# Patient Record
Sex: Male | Born: 1975 | Race: White | Hispanic: No | Marital: Single | Smoking: Former smoker
Health system: Southern US, Community
[De-identification: ages and names within clinical notes are randomized; demographics above are authoritative.]

## PROBLEM LIST (undated history)

## (undated) DIAGNOSIS — G8929 Other chronic pain: Secondary | ICD-10-CM

## (undated) DIAGNOSIS — M545 Low back pain, unspecified: Secondary | ICD-10-CM

## (undated) DIAGNOSIS — I10 Essential (primary) hypertension: Secondary | ICD-10-CM

## (undated) DIAGNOSIS — K5790 Diverticulosis of intestine, part unspecified, without perforation or abscess without bleeding: Secondary | ICD-10-CM

## (undated) DIAGNOSIS — G4733 Obstructive sleep apnea (adult) (pediatric): Secondary | ICD-10-CM

## (undated) DIAGNOSIS — M5136 Other intervertebral disc degeneration, lumbar region: Secondary | ICD-10-CM

## (undated) DIAGNOSIS — Z9989 Dependence on other enabling machines and devices: Secondary | ICD-10-CM

## (undated) DIAGNOSIS — IMO0001 Reserved for inherently not codable concepts without codable children: Secondary | ICD-10-CM

## (undated) HISTORY — PX: COLONOSCOPY: SHX174

## (undated) HISTORY — PX: TONSILLECTOMY: SUR1361

## (undated) HISTORY — PX: BACK SURGERY: SHX140

## (undated) HISTORY — PX: INGUINAL HERNIA REPAIR: SUR1180

---

## 2015-06-27 ENCOUNTER — Ambulatory Visit: Payer: Self-pay | Admitting: Physician Assistant

## 2015-07-15 ENCOUNTER — Inpatient Hospital Stay (HOSPITAL_COMMUNITY)
Admission: RE | Admit: 2015-07-15 | Discharge: 2015-07-15 | Disposition: A | Discharge status: Home / Self Care | Payer: Self-pay | Source: Ambulatory Visit

## 2015-07-16 NOTE — Pre-Procedure Instructions (Signed)
Troy Bush  07/16/2015     Your procedure is scheduled on : Wednesday Jul 24, 2015 at 8:30 AM.  [facility] at 6:30 AM.  Call this number if you have problems the morning of surgery: [phone]    Remember:  Do not eat food or drink liquids after midnight.  Take these medicines the morning of surgery with A SIP OF WATER : NONE   Stop taking any vitamins, herbal medications/supplements, NSAIDs, Ibuprofen, Advil, Motrin, Aleve, etc on Wednesday May 3rd   Do not wear jewelry.  Do not wear lotions, powders, or cologne.   Men may shave face and neck.  Do not bring valuables to the hospital.  [facility] is not responsible for any belongings or valuables.  Contacts, dentures or bridgework may not be worn into surgery.  Leave your suitcase in the car.  After surgery it may be brought to your room.  For patients admitted to the hospital, discharge time will be determined by your treatment team.  Patients discharged the day of surgery will not be allowed to drive home.   Name and phone number of your driver:    Special instructions:  Shower using CHG soap the night before and the morning of your surgery  Please read over the following fact sheets that you were given. Pain Booklet, Coughing and Deep Breathing, Blood Transfusion Information, MRSA Information and Surgical Site Infection Prevention

## 2015-07-18 ENCOUNTER — Encounter (HOSPITAL_COMMUNITY)
Admission: RE | Admit: 2015-07-18 | Discharge: 2015-07-18 | Disposition: A | Discharge status: Home / Self Care | Payer: BLUE CROSS/BLUE SHIELD | Source: Ambulatory Visit | Attending: Orthopedic Surgery | Admitting: Orthopedic Surgery

## 2015-07-18 ENCOUNTER — Encounter (HOSPITAL_COMMUNITY): Payer: Self-pay

## 2015-07-18 DIAGNOSIS — Z01812 Encounter for preprocedural laboratory examination: Secondary | ICD-10-CM | POA: Diagnosis not present

## 2015-07-18 DIAGNOSIS — M5116 Intervertebral disc disorders with radiculopathy, lumbar region: Secondary | ICD-10-CM | POA: Diagnosis not present

## 2015-07-18 DIAGNOSIS — Z87891 Personal history of nicotine dependence: Secondary | ICD-10-CM | POA: Insufficient documentation

## 2015-07-18 DIAGNOSIS — I10 Essential (primary) hypertension: Secondary | ICD-10-CM | POA: Diagnosis not present

## 2015-07-18 DIAGNOSIS — G4733 Obstructive sleep apnea (adult) (pediatric): Secondary | ICD-10-CM | POA: Insufficient documentation

## 2015-07-18 DIAGNOSIS — Z79899 Other long term (current) drug therapy: Secondary | ICD-10-CM | POA: Diagnosis not present

## 2015-07-18 DIAGNOSIS — Z0183 Encounter for blood typing: Secondary | ICD-10-CM | POA: Insufficient documentation

## 2015-07-18 DIAGNOSIS — Z01818 Encounter for other preprocedural examination: Secondary | ICD-10-CM | POA: Diagnosis not present

## 2015-07-18 HISTORY — DX: Other intervertebral disc degeneration, lumbar region: M51.36

## 2015-07-18 HISTORY — DX: Essential (primary) hypertension: I10

## 2015-07-18 HISTORY — DX: Diverticulosis of intestine, part unspecified, without perforation or abscess without bleeding: K57.90

## 2015-07-18 HISTORY — DX: Reserved for inherently not codable concepts without codable children: IMO0001

## 2015-07-18 LAB — CBC
HEMATOCRIT: 44.9 % (ref 39.0–52.0)
HEMOGLOBIN: 15.4 g/dL (ref 13.0–17.0)
MCH: 29.3 pg (ref 26.0–34.0)
MCHC: 34.3 g/dL (ref 30.0–36.0)
MCV: 85.4 fL (ref 78.0–100.0)
Platelets: 214 10*3/uL (ref 150–400)
RBC: 5.26 MIL/uL (ref 4.22–5.81)
RDW: 12.3 % (ref 11.5–15.5)
WBC: 7.6 10*3/uL (ref 4.0–10.5)

## 2015-07-18 LAB — BASIC METABOLIC PANEL
ANION GAP: 11 (ref 5–15)
BUN: 9 mg/dL (ref 6–20)
CHLORIDE: 102 mmol/L (ref 101–111)
CO2: 24 mmol/L (ref 22–32)
CREATININE: 0.94 mg/dL (ref 0.61–1.24)
Calcium: 9.5 mg/dL (ref 8.9–10.3)
GFR calc non Af Amer: >60 mL/min (ref >60)
Glucose, Bld: 175 mg/dL — ABNORMAL HIGH (ref 65–99)
POTASSIUM: 3.5 mmol/L (ref 3.5–5.1)
Sodium: 137 mmol/L (ref 135–145)

## 2015-07-18 LAB — SURGICAL PCR SCREEN
MRSA, PCR: NEGATIVE
Staphylococcus aureus: NEGATIVE

## 2015-07-18 LAB — TYPE AND SCREEN
ABO/RH(D): A NEG
Antibody Screen: NEGATIVE

## 2015-07-18 LAB — ABO/RH: ABO/RH(D): A NEG

## 2015-07-18 NOTE — Progress Notes (Signed)
Mr Troy Bush denies chest pain, he does not see a cardiologist.  Patient saw PCP today and had an EKG.  [facility] [place].  I will request records.

## 2015-07-19 NOTE — Progress Notes (Addendum)
Anesthesia Chart Review:  Pt is a 40 year old male scheduled for L3-4 XLIF and L3-4 posterior spinal interbody fusion on 07/24/2015 with Dr. Shon BatonBrooks.   PCP is Collie SiadMichael Cartledge, PA who has cleared pt for surgery.   PMH includes:  HTN, OSA. Former smoker. BMI 40  Medications include: lisinopril-hctz  Preoperative labs reviewed.  Glucose 175, no dx DM.   EKG 07/18/15: sinus rhythm. Anteroseptal infarct. High lateral infarct. Slight intraventricular conduction delay. Moderate high lateral repolarization disturbance secondary to infarct. Negative T in I and AVL. No acute changes per Mr. Bari MantisCartledge.   If no changes, I anticipate pt can proceed with surgery as scheduled.   Rica Mastngela Anabia Weatherwax, FNP-BC [facility]/Anesthesiology Phone: [phone] 07/22/2015 1:53 PM

## 2015-07-24 ENCOUNTER — Encounter (HOSPITAL_COMMUNITY): Payer: Self-pay | Admitting: *Deleted

## 2015-07-24 ENCOUNTER — Encounter (HOSPITAL_COMMUNITY)
Admission: RE | Disposition: A | Discharge status: Home / Self Care | Payer: Self-pay | Source: Ambulatory Visit | Attending: Orthopedic Surgery

## 2015-07-24 ENCOUNTER — Inpatient Hospital Stay (HOSPITAL_COMMUNITY): Payer: BLUE CROSS/BLUE SHIELD | Admitting: Emergency Medicine

## 2015-07-24 ENCOUNTER — Inpatient Hospital Stay (HOSPITAL_COMMUNITY): Payer: BLUE CROSS/BLUE SHIELD | Admitting: Anesthesiology

## 2015-07-24 ENCOUNTER — Inpatient Hospital Stay (HOSPITAL_COMMUNITY)
Admission: RE | Admit: 2015-07-24 | Discharge: 2015-07-26 | DRG: 460 | Disposition: A | Discharge status: Home / Self Care | Payer: BLUE CROSS/BLUE SHIELD | Source: Ambulatory Visit | Attending: Orthopedic Surgery | Admitting: Orthopedic Surgery

## 2015-07-24 ENCOUNTER — Inpatient Hospital Stay (HOSPITAL_COMMUNITY): Payer: BLUE CROSS/BLUE SHIELD

## 2015-07-24 DIAGNOSIS — I498 Other specified cardiac arrhythmias: Secondary | ICD-10-CM | POA: Diagnosis not present

## 2015-07-24 DIAGNOSIS — M5441 Lumbago with sciatica, right side: Secondary | ICD-10-CM | POA: Diagnosis present

## 2015-07-24 DIAGNOSIS — R739 Hyperglycemia, unspecified: Secondary | ICD-10-CM | POA: Diagnosis present

## 2015-07-24 DIAGNOSIS — E119 Type 2 diabetes mellitus without complications: Secondary | ICD-10-CM | POA: Diagnosis present

## 2015-07-24 DIAGNOSIS — G4733 Obstructive sleep apnea (adult) (pediatric): Secondary | ICD-10-CM | POA: Diagnosis present

## 2015-07-24 DIAGNOSIS — Z419 Encounter for procedure for purposes other than remedying health state, unspecified: Secondary | ICD-10-CM

## 2015-07-24 DIAGNOSIS — M549 Dorsalgia, unspecified: Secondary | ICD-10-CM | POA: Diagnosis present

## 2015-07-24 DIAGNOSIS — G4730 Sleep apnea, unspecified: Secondary | ICD-10-CM | POA: Diagnosis present

## 2015-07-24 DIAGNOSIS — Z79899 Other long term (current) drug therapy: Secondary | ICD-10-CM

## 2015-07-24 DIAGNOSIS — I10 Essential (primary) hypertension: Secondary | ICD-10-CM | POA: Diagnosis present

## 2015-07-24 DIAGNOSIS — E089 Diabetes mellitus due to underlying condition without complications: Secondary | ICD-10-CM | POA: Diagnosis not present

## 2015-07-24 DIAGNOSIS — Z981 Arthrodesis status: Secondary | ICD-10-CM

## 2015-07-24 DIAGNOSIS — Z87891 Personal history of nicotine dependence: Secondary | ICD-10-CM

## 2015-07-24 DIAGNOSIS — M545 Low back pain: Secondary | ICD-10-CM | POA: Diagnosis present

## 2015-07-24 HISTORY — PX: POSTERIOR FUSION LUMBAR SPINE: SUR632

## 2015-07-24 HISTORY — PX: ANTERIOR LAT LUMBAR FUSION: SHX1168

## 2015-07-24 HISTORY — DX: Other chronic pain: G89.29

## 2015-07-24 HISTORY — DX: Obstructive sleep apnea (adult) (pediatric): G47.33

## 2015-07-24 HISTORY — DX: Low back pain: M54.5

## 2015-07-24 HISTORY — DX: Dependence on other enabling machines and devices: Z99.89

## 2015-07-24 HISTORY — DX: Low back pain, unspecified: M54.50

## 2015-07-24 LAB — BASIC METABOLIC PANEL
Anion gap: 10 (ref 5–15)
BUN: 8 mg/dL (ref 6–20)
CALCIUM: 8.8 mg/dL — AB (ref 8.9–10.3)
CHLORIDE: 103 mmol/L (ref 101–111)
CO2: 23 mmol/L (ref 22–32)
CREATININE: 0.98 mg/dL (ref 0.61–1.24)
GFR calc Af Amer: >60 mL/min (ref >60)
GFR calc non Af Amer: >60 mL/min (ref >60)
GLUCOSE: 277 mg/dL — AB (ref 65–99)
Potassium: 3.9 mmol/L (ref 3.5–5.1)
Sodium: 136 mmol/L (ref 135–145)

## 2015-07-24 LAB — MAGNESIUM: Magnesium: 1.8 mg/dL (ref 1.7–2.4)

## 2015-07-24 LAB — TROPONIN I: Troponin I: <0.03 ng/mL (ref <0.031)

## 2015-07-24 SURGERY — ANTERIOR LATERAL LUMBAR FUSION 1 LEVEL
Anesthesia: General

## 2015-07-24 MED ORDER — ONDANSETRON HCL 4 MG/2ML IJ SOLN
INTRAMUSCULAR | Status: AC
  Filled 2015-07-24: qty 2

## 2015-07-24 MED ORDER — OXYCODONE-ACETAMINOPHEN 10-325 MG PO TABS: 1.0000 | ORAL_TABLET | ORAL | Status: AC | PRN

## 2015-07-24 MED ORDER — CEFAZOLIN SODIUM-DEXTROSE 2-4 GM/100ML-% IV SOLN
INTRAVENOUS | Status: AC
  Filled 2015-07-24: qty 100

## 2015-07-24 MED ORDER — ONDANSETRON HCL 4 MG/2ML IJ SOLN
4.0000 mg | INTRAMUSCULAR | Status: DC | PRN
  Filled 2015-07-24: qty 2

## 2015-07-24 MED ORDER — SODIUM CHLORIDE 0.9 % IV SOLN: 250.0000 mL | INTRAVENOUS | Status: DC

## 2015-07-24 MED ORDER — LOSARTAN POTASSIUM 50 MG PO TABS
100.0000 mg | ORAL_TABLET | Freq: Every day | ORAL | Status: DC
  Administered 2015-07-25 – 2015-07-26 (×2): 100 mg via ORAL
  Filled 2015-07-24 (×2): qty 2

## 2015-07-24 MED ORDER — LOSARTAN POTASSIUM-HCTZ 100-12.5 MG PO TABS: 1.0000 | ORAL_TABLET | Freq: Every day | ORAL | Status: DC

## 2015-07-24 MED ORDER — MORPHINE SULFATE (PF) 2 MG/ML IV SOLN
1.0000 mg | INTRAVENOUS | Status: DC | PRN
  Administered 2015-07-24 – 2015-07-25 (×4): 4 mg via INTRAVENOUS
  Filled 2015-07-24 (×4): qty 2

## 2015-07-24 MED ORDER — EPHEDRINE SULFATE 50 MG/ML IJ SOLN
INTRAMUSCULAR | Status: DC | PRN
  Administered 2015-07-24: 10 mg via INTRAVENOUS

## 2015-07-24 MED ORDER — ONDANSETRON HCL 4 MG/2ML IJ SOLN
INTRAMUSCULAR | Status: DC | PRN
  Administered 2015-07-24: 4 mg via INTRAVENOUS

## 2015-07-24 MED ORDER — LACTATED RINGERS IV SOLN
INTRAVENOUS | Status: DC
Start: 2015-07-24 — End: 2015-07-26
  Administered 2015-07-24: 1000 mL via INTRAVENOUS

## 2015-07-24 MED ORDER — ASPIRIN EC 81 MG PO TBEC: 81.0000 mg | DELAYED_RELEASE_TABLET | Freq: Every day | ORAL | Status: AC

## 2015-07-24 MED ORDER — PROPOFOL 1000 MG/100ML IV EMUL
INTRAVENOUS | Status: AC
  Filled 2015-07-24: qty 200

## 2015-07-24 MED ORDER — PHENYLEPHRINE 40 MCG/ML (10ML) SYRINGE FOR IV PUSH (FOR BLOOD PRESSURE SUPPORT)
PREFILLED_SYRINGE | INTRAVENOUS | Status: AC
  Filled 2015-07-24: qty 10

## 2015-07-24 MED ORDER — MAGNESIUM CITRATE PO SOLN
0.5000 | Freq: Once | ORAL | Status: AC
  Administered 2015-07-25: 0.5 via ORAL
  Filled 2015-07-24: qty 296

## 2015-07-24 MED ORDER — HEMOSTATIC AGENTS (NO CHARGE) OPTIME
TOPICAL | Status: DC | PRN
  Administered 2015-07-24 (×2): 1 via TOPICAL

## 2015-07-24 MED ORDER — LIDOCAINE HCL (CARDIAC) 20 MG/ML IV SOLN
INTRAVENOUS | Status: DC | PRN
  Administered 2015-07-24: 100 mg via INTRAVENOUS

## 2015-07-24 MED ORDER — HYDROMORPHONE HCL 1 MG/ML IJ SOLN
INTRAMUSCULAR | Status: AC
  Administered 2015-07-24: 0.5 mg via INTRAVENOUS
  Filled 2015-07-24: qty 1

## 2015-07-24 MED ORDER — FENTANYL CITRATE (PF) 250 MCG/5ML IJ SOLN
INTRAMUSCULAR | Status: AC
  Filled 2015-07-24: qty 5

## 2015-07-24 MED ORDER — MENTHOL 3 MG MT LOZG: 1.0000 | LOZENGE | OROMUCOSAL | Status: DC | PRN

## 2015-07-24 MED ORDER — SODIUM CHLORIDE 0.9% FLUSH
3.0000 mL | Freq: Two times a day (BID) | INTRAVENOUS | Status: DC
  Administered 2015-07-24 – 2015-07-25 (×3): 3 mL via INTRAVENOUS

## 2015-07-24 MED ORDER — DEXAMETHASONE SODIUM PHOSPHATE 10 MG/ML IJ SOLN
INTRAMUSCULAR | Status: AC
  Filled 2015-07-24: qty 1

## 2015-07-24 MED ORDER — ONDANSETRON HCL 4 MG/2ML IJ SOLN: 4.0000 mg | Freq: Once | INTRAMUSCULAR | Status: DC | PRN

## 2015-07-24 MED ORDER — GLYCOPYRROLATE 0.2 MG/ML IJ SOLN
INTRAMUSCULAR | Status: DC | PRN
  Administered 2015-07-24: 0.4 mg via INTRAVENOUS

## 2015-07-24 MED ORDER — THROMBIN 20000 UNITS EX SOLR
CUTANEOUS | Status: AC
  Filled 2015-07-24: qty 20000

## 2015-07-24 MED ORDER — SUCCINYLCHOLINE CHLORIDE 200 MG/10ML IV SOSY
PREFILLED_SYRINGE | INTRAVENOUS | Status: AC
  Filled 2015-07-24: qty 10

## 2015-07-24 MED ORDER — METHOCARBAMOL 500 MG PO TABS
500.0000 mg | ORAL_TABLET | Freq: Four times a day (QID) | ORAL | Status: DC | PRN
  Administered 2015-07-24 – 2015-07-26 (×4): 500 mg via ORAL
  Filled 2015-07-24 (×3): qty 1

## 2015-07-24 MED ORDER — ACETAMINOPHEN 10 MG/ML IV SOLN
INTRAVENOUS | Status: AC
  Filled 2015-07-24: qty 100

## 2015-07-24 MED ORDER — PHENYLEPHRINE HCL 10 MG/ML IJ SOLN
10.0000 mg | INTRAVENOUS | Status: DC | PRN
  Administered 2015-07-24: 30 ug/min via INTRAVENOUS

## 2015-07-24 MED ORDER — HYDROCHLOROTHIAZIDE 12.5 MG PO CAPS
12.5000 mg | ORAL_CAPSULE | Freq: Every day | ORAL | Status: DC
  Administered 2015-07-25 – 2015-07-26 (×2): 12.5 mg via ORAL
  Filled 2015-07-24 (×2): qty 1

## 2015-07-24 MED ORDER — LIDOCAINE 2% (20 MG/ML) 5 ML SYRINGE
INTRAMUSCULAR | Status: AC
  Filled 2015-07-24: qty 5

## 2015-07-24 MED ORDER — DEXAMETHASONE 4 MG PO TABS
4.0000 mg | ORAL_TABLET | Freq: Four times a day (QID) | ORAL | Status: AC
  Administered 2015-07-24: 4 mg via ORAL
  Filled 2015-07-24: qty 1

## 2015-07-24 MED ORDER — HYDROMORPHONE HCL 1 MG/ML IJ SOLN
0.2500 mg | INTRAMUSCULAR | Status: DC | PRN
  Administered 2015-07-24 (×2): 0.5 mg via INTRAVENOUS
  Administered 2015-07-24 (×2): 0.25 mg via INTRAVENOUS

## 2015-07-24 MED ORDER — LACTATED RINGERS IV SOLN
INTRAVENOUS | Status: DC | PRN
  Administered 2015-07-24 (×3): via INTRAVENOUS

## 2015-07-24 MED ORDER — CEFAZOLIN SODIUM 1 G IJ SOLR
INTRAMUSCULAR | Status: AC
  Filled 2015-07-24: qty 10

## 2015-07-24 MED ORDER — DEXAMETHASONE SODIUM PHOSPHATE 4 MG/ML IJ SOLN
4.0000 mg | Freq: Four times a day (QID) | INTRAMUSCULAR | Status: AC
  Administered 2015-07-24 – 2015-07-25 (×2): 4 mg via INTRAVENOUS
  Filled 2015-07-24 (×2): qty 1

## 2015-07-24 MED ORDER — FENTANYL CITRATE (PF) 100 MCG/2ML IJ SOLN
INTRAMUSCULAR | Status: DC | PRN
  Administered 2015-07-24 (×2): 100 ug via INTRAVENOUS
  Administered 2015-07-24: 50 ug via INTRAVENOUS
  Administered 2015-07-24: 150 ug via INTRAVENOUS
  Administered 2015-07-24 (×3): 100 ug via INTRAVENOUS

## 2015-07-24 MED ORDER — ACETAMINOPHEN 10 MG/ML IV SOLN
INTRAVENOUS | Status: DC | PRN
  Administered 2015-07-24: 1000 mg via INTRAVENOUS

## 2015-07-24 MED ORDER — PROPOFOL 10 MG/ML IV BOLUS
INTRAVENOUS | Status: AC
  Filled 2015-07-24: qty 20

## 2015-07-24 MED ORDER — SODIUM CHLORIDE 0.9% FLUSH: 3.0000 mL | INTRAVENOUS | Status: DC | PRN

## 2015-07-24 MED ORDER — CEFAZOLIN SODIUM 1-5 GM-% IV SOLN
1.0000 g | Freq: Three times a day (TID) | INTRAVENOUS | Status: AC
  Administered 2015-07-24 – 2015-07-25 (×2): 1 g via INTRAVENOUS
  Filled 2015-07-24 (×2): qty 50

## 2015-07-24 MED ORDER — METHOCARBAMOL 500 MG PO TABS: 500.0000 mg | ORAL_TABLET | Freq: Three times a day (TID) | ORAL | Status: AC | PRN

## 2015-07-24 MED ORDER — BUPIVACAINE-EPINEPHRINE 0.25% -1:200000 IJ SOLN
INTRAMUSCULAR | Status: DC | PRN
  Administered 2015-07-24: 20 mL

## 2015-07-24 MED ORDER — DOCUSATE SODIUM 100 MG PO CAPS: 100.0000 mg | ORAL_CAPSULE | Freq: Three times a day (TID) | ORAL | Status: AC | PRN

## 2015-07-24 MED ORDER — FLEET ENEMA 7-19 GM/118ML RE ENEM: 1.0000 | ENEMA | Freq: Once | RECTAL | Status: DC

## 2015-07-24 MED ORDER — PHENYLEPHRINE HCL 10 MG/ML IJ SOLN
INTRAMUSCULAR | Status: DC | PRN
  Administered 2015-07-24 (×2): 80 ug via INTRAVENOUS

## 2015-07-24 MED ORDER — PHENOL 1.4 % MT LIQD: 1.0000 | OROMUCOSAL | Status: DC | PRN

## 2015-07-24 MED ORDER — ROCURONIUM BROMIDE 50 MG/5ML IV SOLN
INTRAVENOUS | Status: AC
  Filled 2015-07-24: qty 1

## 2015-07-24 MED ORDER — DEXTROSE 5 % IV SOLN
3.0000 g | Freq: Once | INTRAVENOUS | Status: DC
  Filled 2015-07-24: qty 3000

## 2015-07-24 MED ORDER — METHOCARBAMOL 500 MG PO TABS
ORAL_TABLET | ORAL | Status: AC
  Filled 2015-07-24: qty 1

## 2015-07-24 MED ORDER — BUPIVACAINE-EPINEPHRINE (PF) 0.25% -1:200000 IJ SOLN
INTRAMUSCULAR | Status: AC
  Filled 2015-07-24: qty 30

## 2015-07-24 MED ORDER — 0.9 % SODIUM CHLORIDE (POUR BTL) OPTIME
TOPICAL | Status: DC | PRN
  Administered 2015-07-24 (×2): 1000 mL

## 2015-07-24 MED ORDER — EPHEDRINE 5 MG/ML INJ
INTRAVENOUS | Status: AC
  Filled 2015-07-24: qty 10

## 2015-07-24 MED ORDER — HYDROMORPHONE HCL 1 MG/ML IJ SOLN
INTRAMUSCULAR | Status: AC
  Filled 2015-07-24: qty 1

## 2015-07-24 MED ORDER — PROPOFOL 10 MG/ML IV BOLUS
INTRAVENOUS | Status: DC | PRN
  Administered 2015-07-24: 50 mg via INTRAVENOUS

## 2015-07-24 MED ORDER — MIDAZOLAM HCL 2 MG/2ML IJ SOLN
INTRAMUSCULAR | Status: AC
  Filled 2015-07-24: qty 2

## 2015-07-24 MED ORDER — MEPERIDINE HCL 25 MG/ML IJ SOLN: 6.2500 mg | INTRAMUSCULAR | Status: DC | PRN

## 2015-07-24 MED ORDER — ONDANSETRON HCL 4 MG PO TABS: 4.0000 mg | ORAL_TABLET | Freq: Three times a day (TID) | ORAL | Status: AC | PRN

## 2015-07-24 MED ORDER — DEXAMETHASONE SODIUM PHOSPHATE 10 MG/ML IJ SOLN
INTRAMUSCULAR | Status: DC | PRN
  Administered 2015-07-24: 10 mg via INTRAVENOUS

## 2015-07-24 MED ORDER — CEFAZOLIN SODIUM-DEXTROSE 2-4 GM/100ML-% IV SOLN
2.0000 g | INTRAVENOUS | Status: AC
  Administered 2015-07-24 (×2): 3 g via INTRAVENOUS

## 2015-07-24 MED ORDER — OXYCODONE HCL 5 MG PO TABS
10.0000 mg | ORAL_TABLET | ORAL | Status: DC | PRN
  Administered 2015-07-24 – 2015-07-26 (×6): 10 mg via ORAL
  Filled 2015-07-24 (×6): qty 2

## 2015-07-24 MED ORDER — MIDAZOLAM HCL 5 MG/5ML IJ SOLN
INTRAMUSCULAR | Status: DC | PRN
  Administered 2015-07-24: 2 mg via INTRAVENOUS

## 2015-07-24 MED ORDER — SUCCINYLCHOLINE CHLORIDE 200 MG/10ML IV SOSY
PREFILLED_SYRINGE | INTRAVENOUS | Status: DC | PRN
  Administered 2015-07-24: 160 mg via INTRAVENOUS

## 2015-07-24 MED ORDER — THROMBIN 20000 UNITS EX KIT
PACK | CUTANEOUS | Status: DC | PRN
  Administered 2015-07-24: 20000 [IU] via TOPICAL

## 2015-07-24 MED ORDER — METHOCARBAMOL 1000 MG/10ML IJ SOLN
500.0000 mg | Freq: Four times a day (QID) | INTRAVENOUS | Status: DC | PRN
  Filled 2015-07-24: qty 5

## 2015-07-24 SURGICAL SUPPLY — 84 items
BLADE CLIPPER SURG (BLADE) ×3 IMPLANT
BLADE SURG 10 STRL SS (BLADE) ×6 IMPLANT
BLADE SURG ROTATE 9660 (MISCELLANEOUS) IMPLANT
BONE MATRIX OSTEOCEL PRO MED (Bone Implant) ×3 IMPLANT
CAGE COROENT XL TI 12X18X50-10 (Cage) ×3 IMPLANT
CLOSURE STERI-STRIP 1/2X4 (GAUZE/BANDAGES/DRESSINGS) ×2
CLOSURE WOUND 1/2 X4 (GAUZE/BANDAGES/DRESSINGS)
CLSR STERI-STRIP ANTIMIC 1/2X4 (GAUZE/BANDAGES/DRESSINGS) ×4 IMPLANT
CORDS BIPOLAR (ELECTRODE) ×3 IMPLANT
COVER SURGICAL LIGHT HANDLE (MISCELLANEOUS) ×3 IMPLANT
DRAPE C-ARM 42X72 X-RAY (DRAPES) ×3 IMPLANT
DRAPE C-ARMOR (DRAPES) ×3 IMPLANT
DRAPE INCISE IOBAN 66X45 STRL (DRAPES) ×6 IMPLANT
DRAPE ORTHO SPLIT 77X108 STRL (DRAPES) ×2
DRAPE POUCH INSTRU U-SHP 10X18 (DRAPES) ×3 IMPLANT
DRAPE SURG ORHT 6 SPLT 77X108 (DRAPES) ×1 IMPLANT
DRAPE U-SHAPE 47X51 STRL (DRAPES) ×6 IMPLANT
DRSG AQUACEL AG ADV 3.5X10 (GAUZE/BANDAGES/DRESSINGS) ×6 IMPLANT
DRSG MEPILEX BORDER 4X8 (GAUZE/BANDAGES/DRESSINGS) ×3 IMPLANT
DURAPREP 26ML APPLICATOR (WOUND CARE) ×3 IMPLANT
ELECT BLADE 4.0 EZ CLEAN MEGAD (MISCELLANEOUS) ×3
ELECT PENCIL ROCKER SW 15FT (MISCELLANEOUS) ×3 IMPLANT
ELECT REM PT RETURN 9FT ADLT (ELECTROSURGICAL) ×3
ELECTRODE BLDE 4.0 EZ CLN MEGD (MISCELLANEOUS) ×1 IMPLANT
ELECTRODE REM PT RTRN 9FT ADLT (ELECTROSURGICAL) ×1 IMPLANT
GAUZE SPONGE 4X4 16PLY XRAY LF (GAUZE/BANDAGES/DRESSINGS) ×3 IMPLANT
GLOVE BIO SURGEON STRL SZ 6.5 (GLOVE) ×2 IMPLANT
GLOVE BIO SURGEONS STRL SZ 6.5 (GLOVE) ×1
GLOVE BIOGEL PI IND STRL 6.5 (GLOVE) ×1 IMPLANT
GLOVE BIOGEL PI IND STRL 8.5 (GLOVE) ×1 IMPLANT
GLOVE BIOGEL PI INDICATOR 6.5 (GLOVE) ×2
GLOVE BIOGEL PI INDICATOR 8.5 (GLOVE) ×2
GLOVE SS BIOGEL STRL SZ 8.5 (GLOVE) ×1 IMPLANT
GLOVE SUPERSENSE BIOGEL SZ 8.5 (GLOVE) ×2
GOWN STRL REUS W/ TWL XL LVL3 (GOWN DISPOSABLE) ×2 IMPLANT
GOWN STRL REUS W/TWL 2XL LVL3 (GOWN DISPOSABLE) ×6 IMPLANT
GOWN STRL REUS W/TWL XL LVL3 (GOWN DISPOSABLE) ×4
GUIDEWIRE NITINOL BEVEL TIP (WIRE) ×12 IMPLANT
KIT BASIN OR (CUSTOM PROCEDURE TRAY) ×3 IMPLANT
KIT DILATOR XLIF 5 (KITS) ×1 IMPLANT
KIT ROOM TURNOVER OR (KITS) ×3 IMPLANT
KIT SURGICAL ACCESS MAXCESS 4 (KITS) ×6 IMPLANT
KIT XLIF (KITS) ×2
MODULE NVM5 NEXT GEN EMG (NEEDLE) ×3 IMPLANT
NEEDLE 22X1 1/2 (OR ONLY) (NEEDLE) ×3 IMPLANT
NEEDLE I-PASS III (NEEDLE) ×3 IMPLANT
NEEDLE SPNL 18GX3.5 QUINCKE PK (NEEDLE) ×3 IMPLANT
NS IRRIG 1000ML POUR BTL (IV SOLUTION) ×6 IMPLANT
PACK LAMINECTOMY ORTHO (CUSTOM PROCEDURE TRAY) ×3 IMPLANT
PACK UNIVERSAL I (CUSTOM PROCEDURE TRAY) ×3 IMPLANT
PAD ARMBOARD 7.5X6 YLW CONV (MISCELLANEOUS) ×6 IMPLANT
ROD RELINE MAS LORD 5.5X45MM (Rod) ×6 IMPLANT
SCREW LOCK RELINE 5.5 TULIP (Screw) ×12 IMPLANT
SCREW RELINE MAS POLY 6.5X40MM (Screw) ×6 IMPLANT
SCREW RELINE RED 6.5X45MM POLY (Screw) ×6 IMPLANT
SPONGE LAP 4X18 X RAY DECT (DISPOSABLE) ×9 IMPLANT
SPONGE SURGIFOAM ABS GEL 100 (HEMOSTASIS) ×3 IMPLANT
STAPLER VISISTAT 35W (STAPLE) ×3 IMPLANT
STRIP CLOSURE SKIN 1/2X4 (GAUZE/BANDAGES/DRESSINGS) IMPLANT
SURGIFLO W/THROMBIN 8M KIT (HEMOSTASIS) ×3 IMPLANT
SUT BONE WAX W31G (SUTURE) ×3 IMPLANT
SUT MNCRL AB 3-0 PS2 18 (SUTURE) ×9 IMPLANT
SUT MON AB 2-0 CT1 36 (SUTURE) IMPLANT
SUT MON AB 3-0 SH 27 (SUTURE) ×8
SUT MON AB 3-0 SH27 (SUTURE) ×4 IMPLANT
SUT STRATAFIX 0 PDS 27 VIOLET (SUTURE) ×3
SUT STRATAFIX 1PDS 45CM VIOLET (SUTURE) ×3 IMPLANT
SUT STRATAFIX MNCRL+ 3-0 PS-2 (SUTURE) ×1
SUT STRATAFIX MONOCRYL 3-0 (SUTURE) ×2
SUT STRATAFIX SPIRAL + 2-0 (SUTURE) ×3 IMPLANT
SUT VIC AB 1 CT1 27 (SUTURE) ×4
SUT VIC AB 1 CT1 27XBRD ANBCTR (SUTURE) ×2 IMPLANT
SUT VIC AB 1 CTX 36 (SUTURE) ×4
SUT VIC AB 1 CTX36XBRD ANBCTR (SUTURE) ×2 IMPLANT
SUT VIC AB 2-0 CT1 18 (SUTURE) ×12 IMPLANT
SUTURE STRATFX 0 PDS 27 VIOLET (SUTURE) ×1 IMPLANT
SUTURE STRATFX MNCRL+ 3-0 PS-2 (SUTURE) ×1 IMPLANT
SYR BULB IRRIGATION 50ML (SYRINGE) ×3 IMPLANT
SYR CONTROL 10ML LL (SYRINGE) ×3 IMPLANT
TAPE CLOTH 4X10 WHT NS (GAUZE/BANDAGES/DRESSINGS) ×6 IMPLANT
TOWEL OR 17X24 6PK STRL BLUE (TOWEL DISPOSABLE) ×3 IMPLANT
TOWEL OR 17X26 10 PK STRL BLUE (TOWEL DISPOSABLE) ×3 IMPLANT
TRAY FOLEY CATH 16FR SILVER (SET/KITS/TRAYS/PACK) ×3 IMPLANT
WATER STERILE IRR 1000ML POUR (IV SOLUTION) ×3 IMPLANT

## 2015-07-24 NOTE — Brief Op Note (Signed)
07/24/2015  12:12 PM  PATIENT:  Troy Bush  40 y.o. 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Other Dictation: Dictation Number [id number]  PLAN OF CARE: Admit to inpatient   PATIENT DISPOSITION:  PACU - hemodynamically stable.

## 2015-07-24 NOTE — Anesthesia Procedure Notes (Signed)
Procedure Name: Intubation Date/Time: 07/24/2015 8:42 AM Performed by: Ferol LuzMCMILLEN, Amor Hyle L Pre-anesthesia Checklist: Patient identified, Emergency Drugs available, Suction available and Patient being monitored Patient Re-evaluated:Patient Re-evaluated prior to inductionOxygen Delivery Method: Circle System Utilized Preoxygenation: Pre-oxygenation with 100% oxygen Intubation Type: IV induction Ventilation: Mask ventilation without difficulty Laryngoscope Size: Mac and 4 Grade View: Grade I Tube type: Oral Tube size: 8.0 mm Number of attempts: 1 Airway Equipment and Method: Stylet and Oral airway Placement Confirmation: ETT inserted through vocal cords under direct vision,  positive ETCO2 and breath sounds checked- equal and bilateral Secured at: 22 cm Tube secured with: Tape Dental Injury: Teeth and Oropharynx as per pre-operative assessment

## 2015-07-24 NOTE — Anesthesia Postprocedure Evaluation (Signed)
Anesthesia Post Note  Patient: Troy Bush  Procedure(s) Performed: Procedure(s) (LRB): XLIF L3 - L4 1 LEVEL AND A POSTERIOR SPINAL FUSION INTERBODY L3 - L4 (N/A)  Patient location during evaluation: PACU Anesthesia Type: General Level of consciousness: awake and alert Pain management: pain level controlled Vital Signs Assessment: post-procedure vital signs reviewed and stable Respiratory status: spontaneous breathing, nonlabored ventilation, respiratory function stable and patient connected to nasal cannula oxygen Cardiovascular status: blood pressure returned to baseline and stable Postop Assessment: no signs of nausea or vomiting Anesthetic complications: no Comments: Post Op EKG - NSR, no significant ST changes. Troponin negative. Dr. Shon BatonBrooks plans to obtain consult for junctional rhythm during surgery.     Last Vitals:  Filed Vitals:   07/24/15 1600 07/24/15 1615  BP: 108/57 102/54  Pulse:    Temp:    Resp:      Last Pain:  Filed Vitals:   07/24/15 1634  PainSc: 5                  Shelton SilvasKevin D Fany Cavanaugh

## 2015-07-24 NOTE — H&P (Signed)
History of Present Illness  The patient is a 40 year old male who comes in today for a preoperative History and Physical. The patient is scheduled for a XLIF L3-4 WITH PSFI L3-4 to be performed by Dr. Debria Garret D. Shon Baton, MD at [facility] on 07-24-15 . Please see the hospital record for complete dictated history and physical. Pt carries a diagnosis of sleep apnea.  Additional reasons for visit:  Transition into care is described as the following: The patient is transitioning into care and a summary of care was reviewed.   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 Diabetes Mellitus  Father. child First Degree Relatives  reported Hypertension  Father, Paternal Grandfather, Sister. Osteoarthritis  Maternal Grandmother, Mother.  Social History  Tobacco / smoke exposure  05/03/2015: yes Tobacco use  Former smoker. 05/03/2015: smoke(d) 1 pack(s) per day Children  1 Current drinker  05/03/2015: Currently drinks beer only occasionally per week Current work status  working full time Exercise  Exercises never Living situation  live alone Marital status  single No history of drug/alcohol rehab  Not under pain contract  Number of flights of stairs before winded  greater than 5  Medication History Losartan Potassium-HCTZ (100-12.5MG  Tablet, Oral) Active. Baclofen (  Tablet, Oral) Active. Aleve (  Tablet, Oral) Active. Medications Reconciled  Past Surgical History No pertinent past surgical history   Other Problems (Robin C. Young; 07/15/2015 8:48 AM) Diverticulitis Of Colon   Vitals (Robin C. Young; 07/15/2015 8:53 AM) 07/15/2015 8:51 AM Weight: 250 lb Height: 67.5in Body Surface Area: 2.24 m Body Mass Index: 38.58 kg/m  Temp.:  98.10F(Oral)  Pulse: 67 (Regular)  BP: 140/82 (Sitting, Right Arm, Standard)  Physical Exam  General General Appearance-Not in acute distress. Orientation-Oriented X3. Build & Nutrition-Well nourished and Well developed.  Integumentary General Characteristics Surgical Scars - no surgical scar evidence of previous lumbar surgery. Lumbar Spine-Skin examination of the lumbar spine is without deformity, skin lesions, lacerations or abrasions.  Chest and Lung Exam Auscultation Breath sounds - Normal and Clear.  Cardiovascular Auscultation Rhythm - Regular rate and rhythm.  Abdomen Palpation/Percussion Palpation and Percussion of the abdomen reveal - Soft, Non Tender and No Rebound tenderness.  Peripheral Vascular Lower Extremity Palpation - Posterior tibial pulse - Bilateral - 2+. Dorsalis pedis pulse - Bilateral - 2+.  Neurologic Sensation Lower Extremity - Left - sensation is intact in the lower extremity. Right - sensation is diminished in the lower extremity. Reflexes Patellar Reflex - Bilateral - 2+. Achilles Reflex - Bilateral - 2+. Clonus - Bilateral - clonus not present. Testing Seated Straight Leg Raise - Left - Seated straight leg raise negative. Right - Seated straight leg raise positive.  Musculoskeletal Spine/Ribs/Pelvis  Lumbosacral Spine: Inspection and Palpation - Tenderness - left lumbar paraspinals tender to palpation and right lumbar paraspinals tender to palpation. Strength and Tone: Strength - Hip Flexion - Bilateral - 5/5. Knee Extension - Bilateral - 5/5. Knee Flexion - Bilateral - 5/5. Ankle Dorsiflexion - Bilateral - 5/5. Ankle Plantarflexion - Bilateral - 5/5. Heel walk - Bilateral - able to heel walk with moderate difficulty. Toe Walk - Bilateral - able to walk on toes with mild difficulty. Heel-Toe Walk - Bilateral - able to heel-toe walk with mild difficulty. ROM - Flexion - moderately decreased range of motion and painful. Extension -  moderately decreased range of motion and painful. Left Lateral Bending - moderately decreased range of motion and painful. Right Lateral Bending - moderately decreased range of motion and painful. Right Rotation - moderately decreased range of motion and painful. Left Rotation - moderately decreased range of motion and painful. Pain - . Lumbosacral Spine - Waddell's Signs - no Waddell's signs present. Lower Extremity Range of Motion - No true hip, knee or ankle pain with range of motion. Gait and Station - [employer] Devices - no assistive devices.  Assessment & Plan  Goal Of Surgery: Discussed that goal of surgery is to reduce pain and improve function and quality of life. Patient is aware that despite all appropriate treatment that there pain and function could be the same, worse, or different.  Lateral Lumbar Fusion: Risksof surgery include, but are not limited to: Death, stroke, paralysis, nerve root damage/injury, bleeding, blood clots, loss of bowel/bladder control, sexual dysfunction, retrograde ejaculation, hardware failure, or mal-position, spinal fluid leak, adjacent segment disease, non-union, need for further surgery, ongoing or worse pain, and injury to bladder, bowel and abdominal contents, infection. Injury to the lumbar plexus resulting in temporary or permanent hip flexor weakness and difficulty walking. Need for supplemental posterior surgery including decompression and need for screw fixation.  The patient presents today for HPI for his surgical intervention on 07/24/15. He continues to have significant back, buttock, and right leg pain, numbness and dysesthesias of the right lower extremities, occasionally will go below the knee, but usually is in the L3-4 distribution. Back pain is especially increased with extension, but is also increased with flexion and rotation. The patient noted benefit to the L3-4 epidural injection, but unfortunately relief was short lived. He has also trialed  physical therapy.  He continues to have significant back, buttock and intermittent right leg pain. Mostly numbness and dysesthesias in the anterior and medial aspect of the thigh, occasionally will go below the knee, but mostly up in the L3-4 distribution. His back pain is horrific, worse with extension of the spine.  He has been treated with formal physical therapy, injection therapy, activity modification and nothing has provided any significant positive longterm results. We did discuss the potential of a lateral L3-4 fusion given the positive nature of the injection and the MRI findings at L3-4, I do think at this point having tried and failed multiple forms of conservative management over the last two years that surgery is reasonable.

## 2015-07-24 NOTE — Consult Note (Signed)
[facility] Medical Consultation  Troy BisJamie Huntley JYN:[id number] DOB: 1975-03-17 DOA: 07/24/2015 PCP: Starleen Armsartledge, Michael H, PA-C   Requesting physician: Venita Lickahari Brooks, MD Date of consultation: 07/24/15 Reason for consultation:  Post-op dysrhythmia  Impression/Recommendations Active Problems:   Back pain  Chronic back pain, s/p lumbar fusion today.  -Orthopedist admitting patient  Post-op junctional rhythm, transient. Asymptomatic. Back in sinus rhythm. No known cardiac history -Agree with troponin x 3 -Agree with EKG now -Monitor on telemetry -BMET, Mg+ now  Hyperglycemia, blood glucose in 170s on last labs 07/18/15. Patient obese, diabetes in mother.  -CBGs  Q4 -A1c . If A1c elevated, Diabetes Coordinator should be asked to evaluate.   Hypertension. Currently controlled.  -Resume home Hyzaar tomorrow  OSA. Uses CPAP at home -RT for CPCP  We will followup again tomorrow. Please contact me if I can be of assistance in the meanwhile. Thank you for this consultation.  Chief Complaint: abnormal heart rhythm  HPI:  Patient is a 40 year old male with a medical history significant for HTN,  sleep apnea who is status post L3-L4 posterior spinal fusion today. We were asked to see the patient for evaluation of a junctional rhythm which occurred immediately postop. Patient denies any cardiac history other than hypertension. At home he has had no shortness of breath, no chest pain. Other than chronic back pain patient has no medical complaints  Review of Systems:  Constitutional: Denies fever, chills, diaphoresis, appetite change and fatigue.  HEENT: Denies photophobia, eye pain, redness, hearing loss, ear pain, congestion, sore throat, rhinorrhea, sneezing, mouth sores, trouble swallowing, neck pain, neck stiffness and tinnitus.  Respiratory: Denies SOB, DOE, cough, chest tightness, and wheezing.  Cardiovascular: Denies chest pain, palpitations and leg swelling.  Gastrointestinal:  Denies nausea, vomiting, abdominal pain, diarrhea, constipation, blood in stool and abdominal distention.  Genitourinary: Denies dysuria, urgency, frequency, hematuria, flank pain and difficulty urinating.  Musculoskeletal: Denies myalgias,,oint swelling, arthralgias and gait problem.  Skin: Denies pallor, rash and wound.  Neurological: Denies dizziness, seizures, syncope, weakness, light-headedness, numbness and headaches.  Hematological: Denies adenopathy. Easy bruising, personal or family bleeding history  Psychiatric/Behavioral: Denies suicidal ideation, mood changes, confusion, nervousness, sleep disturbance and agitation    Past Medical History  Diagnosis Date  . Hypertension   . Shortness of breath dyspnea     with exertion  . Diverticular disease   . Sleep apnea   . DDD (degenerative disc disease), lumbar    Past Surgical History  Procedure Laterality Date  . Tonsillectomy      Age 40  . Inguinal hernia repair Right     around age 89  . Colonoscopy     Social History:  reports that he has quit smoking. He has never used smokeless tobacco. He reports that he does not drink alcohol or use illicit drugs.  No Known Allergies Family History  Problem Relation Age of Onset  . CVA Paternal Grandfather   . Hypertension Father   . Diabetes Father      Prior to Admission medications   Medication Sig Start Date End Date Taking? Authorizing Provider  baclofen (LIORESAL) 10 MG tablet Take 10 mg by mouth 3 (three) times daily as needed for muscle spasms.   Yes Historical Provider, MD  losartan-hydrochlorothiazide (HYZAAR) 100-12.5 MG tablet Take 1 tablet by mouth daily. 05/18/15  Yes Historical Provider, MD  aspirin EC 81 MG tablet Take 1 tablet (81 mg total) by mouth daily. Start on POD #2 07/24/15   Venita Lickahari Brooks, MD  docusate sodium (COLACE) 100 MG capsule Take 1 capsule (100 mg total) by mouth 3 (three) times daily as needed for mild constipation. 07/24/15   Venita Lick, MD    methocarbamol (ROBAXIN) 500 MG tablet Take 1 tablet (500 mg total) by mouth 3 (three) times daily as needed for muscle spasms. 07/24/15   Venita Lick, MD  ondansetron (ZOFRAN) 4 MG tablet Take 1 tablet (4 mg total) by mouth every 8 (eight) hours as needed for nausea or vomiting. 07/24/15   Venita Lick, MD  oxyCODONE-acetaminophen (PERCOCET) 10-325 MG tablet Take 1 tablet by mouth every 4 (four) hours as needed for pain. 07/24/15   Venita Lick, MD   Physical Exam: Blood pressure 122/61, pulse 77, temperature 98.1 F (36.7 C), temperature source Oral, resp. rate 17, height 5\' 7"  (1.702 m), weight 116.121 kg (256 lb), SpO2 98 %. Filed Vitals:   07/24/15 1258 07/24/15 1315  BP: 123/57 122/61  Pulse:  77  Temp: 98.1 F (36.7 C)   Resp:  17    Constitutional: pleasant, obese, white male in NAD  Head: Normocephalic and atraumatic  Mouth: no erythema or exudates, MMM  Eyes: PER,  conjunctivae normal, No scleral icterus.  Neck: Supple, Trachea midline normal ROM, No JVD, mass Cardiovascular: RRR, S1 normal, S2 normal, no MRG, pulses symmetric and intact bilaterally  Pulmonary/Chest: CTAB, no wheezes, rales, or rhonchi  Abdominal: Soft. Obese,non-tender, a few bowel sounds, no masses, organomegaly, or guarding present.  Musculoskeletal: No joint deformities, erythema, or stiffness, ROM full and no nontender Ext: no edema and no cyanosis, pulses palpable bilaterally (DP and PT)  Hematology: no cervical, inginal, or axillary adenopathy.  Neurological: A&O x3, Strenght is normal and symmetric bilaterally, cranial nerve II-XII are grossly intact, no focal motor deficit, sensory intact to light touch bilaterally.  Skin: Warm, dry and intact. No rash, cyanosis, or clubbing.  Psychiatric: Normal mood and affect. speech and behavior is normal. Judgment and thought content normal. Cognition and memory are normal.   Labs on Admission:  Basic Metabolic Panel:  Recent Labs Lab 07/18/15 1453  NA  137  K 3.5  CL 102  CO2 24  GLUCOSE 175*  BUN 9  CREATININE 0.94  CALCIUM 9.5   CBC:  Recent Labs Lab 07/18/15 1453  WBC 7.6  HGB 15.4  HCT 44.9  MCV 85.4  PLT 214   Radiological Exams on Admission: Dg Lumbar Spine 2-3 Views  07/24/2015  CLINICAL DATA:  Surgical posterior fusion of L3-4. EXAM: LUMBAR SPINE - 2-3 VIEW; DG C-ARM GT 120 MIN FLUOROSCOPY TIME:  5 minutes 34 seconds. COMPARISON:  None. FINDINGS: Two intraoperative fluoroscopic images of the lower lumbar spine demonstrate the patient be status post surgical posterior fusion of L3-4 with interbody fusion. Good alignment of vertebral bodies is noted. IMPRESSION: Status post surgical posterior fusion of L3-4. Electronically Signed   By: Lupita Raider, M.D.   On: 07/24/2015 12:31   Dg C-arm Gt 120 Min  07/24/2015  CLINICAL DATA:  Surgical posterior fusion of L3-4. EXAM: LUMBAR SPINE - 2-3 VIEW; DG C-ARM GT 120 MIN FLUOROSCOPY TIME:  5 minutes 34 seconds. COMPARISON:  None. FINDINGS: Two intraoperative fluoroscopic images of the lower lumbar spine demonstrate the patient be status post surgical posterior fusion of L3-4 with interbody fusion. Good alignment of vertebral bodies is noted. IMPRESSION: Status post surgical posterior fusion of L3-4. Electronically Signed   By: Lupita Raider, M.D.   On: 07/24/2015 12:31   EKG Normal  sinus rhythm Rightward axis Borderline ECG  Time spent: 45 minutes  Willette Cluster, NP [facility] Pager [phone]  If 7PM-7AM, please contact night-coverage [email or web address] Password [email or web address] 07/24/2015, 1:24 PM

## 2015-07-24 NOTE — Progress Notes (Signed)
Patient wears C-PAP at home but has no order for it in the system.

## 2015-07-24 NOTE — Transfer of Care (Signed)
Immediate Anesthesia Transfer of Care Note  Patient: Troy Bush  Procedure(s) Performed: Procedure(s): XLIF L3 - L4 1 LEVEL AND A POSTERIOR SPINAL FUSION INTERBODY L3 - L4 (N/A)  Patient Location: PACU  Anesthesia Type:General  Level of Consciousness: awake, alert , oriented and patient cooperative  Airway & Oxygen Therapy: Patient Spontanous Breathing and Patient connected to nasal cannula oxygen  Post-op Assessment: Report given to RN, Post -op Vital signs reviewed and stable and Patient moving all extremities  Post vital signs: Reviewed and stable  Last Vitals:  Filed Vitals:   07/24/15 0651  BP: 122/71  Pulse: 58  Temp: 36.7 C  Resp: 20    Last Pain: There were no vitals filed for this visit.       Complications: No apparent anesthesia complications

## 2015-07-24 NOTE — Anesthesia Preprocedure Evaluation (Addendum)
Anesthesia Evaluation  Patient identified by MRN, date of birth, ID band Patient awake    Reviewed: Allergy & Precautions, NPO status , Patient's Chart, lab work & pertinent test results  Airway Mallampati: II  TM Distance: >3 FB Neck ROM: Full    Dental   Pulmonary sleep apnea , former smoker,    Pulmonary exam normal        Cardiovascular hypertension, Pt. on medications Normal cardiovascular exam     Neuro/Psych negative neurological ROS  negative psych ROS   GI/Hepatic negative GI ROS, Neg liver ROS,   Endo/Other  negative endocrine ROS  Renal/GU negative Renal ROS  negative genitourinary   Musculoskeletal  (+) Arthritis ,   Abdominal   Peds negative pediatric ROS (+)  Hematology negative hematology ROS (+)   Anesthesia Other Findings   Reproductive/Obstetrics negative OB ROS                            Anesthesia Physical Anesthesia Plan  ASA: III  Anesthesia Plan: General   Post-op Pain Management:    Induction: Intravenous  Airway Management Planned: Oral ETT  Additional Equipment:   Intra-op Plan:   Post-operative Plan: Extubation in OR  Informed Consent: I have reviewed the patients History and Physical, chart, labs and discussed the procedure including the risks, benefits and alternatives for the proposed anesthesia with the patient or authorized representative who has indicated his/her understanding and acceptance.     Plan Discussed with: CRNA and Surgeon  Anesthesia Plan Comments:         Anesthesia Quick Evaluation

## 2015-07-25 ENCOUNTER — Inpatient Hospital Stay (HOSPITAL_COMMUNITY): Payer: BLUE CROSS/BLUE SHIELD

## 2015-07-25 ENCOUNTER — Encounter (HOSPITAL_COMMUNITY): Payer: Self-pay | Admitting: Orthopedic Surgery

## 2015-07-25 DIAGNOSIS — R739 Hyperglycemia, unspecified: Secondary | ICD-10-CM

## 2015-07-25 DIAGNOSIS — G4730 Sleep apnea, unspecified: Secondary | ICD-10-CM

## 2015-07-25 DIAGNOSIS — E089 Diabetes mellitus due to underlying condition without complications: Secondary | ICD-10-CM

## 2015-07-25 DIAGNOSIS — E119 Type 2 diabetes mellitus without complications: Secondary | ICD-10-CM

## 2015-07-25 DIAGNOSIS — I498 Other specified cardiac arrhythmias: Secondary | ICD-10-CM

## 2015-07-25 DIAGNOSIS — I10 Essential (primary) hypertension: Secondary | ICD-10-CM

## 2015-07-25 LAB — TROPONIN I
Troponin I: <0.03 ng/mL (ref <0.031)
Troponin I: <0.03 ng/mL (ref <0.031)

## 2015-07-25 LAB — GLUCOSE, CAPILLARY
GLUCOSE-CAPILLARY: 336 mg/dL — AB (ref 65–99)
GLUCOSE-CAPILLARY: 249 mg/dL — AB (ref 65–99)
Glucose-Capillary: 208 mg/dL — ABNORMAL HIGH (ref 65–99)

## 2015-07-25 LAB — HEMOGLOBIN A1C
Hgb A1c MFr Bld: 9.1 % — ABNORMAL HIGH (ref 4.8–5.6)
Mean Plasma Glucose: 214 mg/dL

## 2015-07-25 MED ORDER — INSULIN ASPART 100 UNIT/ML ~~LOC~~ SOLN
0.0000 [IU] | Freq: Every day | SUBCUTANEOUS | Status: DC
  Administered 2015-07-25: 2 [IU] via SUBCUTANEOUS

## 2015-07-25 MED ORDER — INSULIN GLARGINE 100 UNIT/ML ~~LOC~~ SOLN
5.0000 [IU] | Freq: Every day | SUBCUTANEOUS | Status: DC
  Administered 2015-07-25: 5 [IU] via SUBCUTANEOUS
  Filled 2015-07-25 (×2): qty 0.05

## 2015-07-25 MED ORDER — LIVING WELL WITH DIABETES BOOK
Freq: Once | Status: AC
  Administered 2015-07-25: 12:00:00
  Filled 2015-07-25: qty 1

## 2015-07-25 MED ORDER — INSULIN ASPART 100 UNIT/ML ~~LOC~~ SOLN
0.0000 [IU] | Freq: Three times a day (TID) | SUBCUTANEOUS | Status: DC
  Administered 2015-07-25: 7 [IU] via SUBCUTANEOUS
  Administered 2015-07-25: 3 [IU] via SUBCUTANEOUS
  Administered 2015-07-26: 2 [IU] via SUBCUTANEOUS

## 2015-07-25 MED FILL — Thrombin For Soln 20000 Unit: CUTANEOUS | Qty: 1 | Status: AC

## 2015-07-25 NOTE — Evaluation (Addendum)
Occupational Therapy Evaluation & Discharge  Patient Details Name: Troy Bush MRN: [id number] DOB: [birth year] Today's Date: 07/25/2015    History of Present Illness Patient was admitted for a XLIF L3 - L4 1 LEVEL AND A POSTERIOR SPINAL FUSION INTERBODY L3 - L4.PMH: HTN, dyspnea, DDD, chronic low back pain, Diverticular disease   Clinical Impression   Patient admitted with above. Patient independent PTA. Patient currently functioning at an overall mod I level, requiring up to mod assist for LB ADLs due to back precautions.  No additional OT needs identified, D/C from acute OT services and no additional follow-up OT needs at this time. All appropriate education provided to patient and family (fiance). Please re-order OT if needed.      Follow Up Recommendations  No OT follow up;Supervision - Intermittent (assistance with LB ADLs)    Equipment Recommendations  3 in 1 bedside comode    Recommendations for Other Services  None at this time   Precautions / Restrictions Precautions Precautions: Back;Fall (low fall risk) Required Braces or Orthoses: Spinal Brace Spinal Brace: Lumbar corset;Applied in sitting position Restrictions Weight Bearing Restrictions: No    Mobility Bed Mobility Overal bed mobility: Modified Independent General bed mobility comments: Educated pt on correct body mechanics and technique for bed mobility, pt caught on quickly and mod I by end of session.   Transfers Overall transfer level: Modified independent Equipment used: None General transfer comment: Signed off patient for transfers, PT/OT left sign on door.     Balance Overall balance assessment: No apparent balance deficits (not formally assessed)    ADL Overall ADL's : Needs assistance/impaired Eating/Feeding: Independent;Sitting   Grooming: Set up;Standing Grooming Details (indicate cue type and reason): Educated pt on how to perform grooming tasks without breaking back precautions  Upper Body  Bathing: Set up;Sitting   Lower Body Bathing: Moderate assistance;Sit to/from stand Lower Body Bathing Details (indicate cue type and reason): Pt unable to reach BLEs Upper Body Dressing : Set up;Sitting Upper Body Dressing Details (indicate cue type and reason): set-up for back brace as well, education provided on correct way/orientation of brace Lower Body Dressing: Moderate assistance;Sit to/from stand Lower Body Dressing Details (indicate cue type and reason): Pt unable to reach BLEs, fiance states she can assist prn. Educated pt on working on crossing feet over knees and how he can use a reacher to increase independence with this.  Toilet Transfer: Modified Independent;BSC       Tub/ Shower Transfer: Modified independent;3 in 1;Walk-in shower;Ambulation     General ADL Comments: Pt overall mod I with functional mobility. Pt just requires increased time and use of DME to increase independence at times. Fiance states she will be able to provide assistance prn.     Pertinent Vitals/Pain Pain Assessment: Faces Faces Pain Scale: Hurts little more Pain Location: back Pain Descriptors / Indicators: Aching;Sore Pain Intervention(s): Monitored during session;Repositioned     Hand Dominance Right   Extremity/Trunk Assessment Upper Extremity Assessment Upper Extremity Assessment: Overall WFL for tasks assessed   Lower Extremity Assessment Lower Extremity Assessment: Defer to PT evaluation   Cervical / Trunk Assessment Cervical / Trunk Assessment: Normal   Communication Communication Communication: No difficulties   Cognition Arousal/Alertness: Awake/alert Behavior During Therapy: WFL for tasks assessed/performed Overall Cognitive Status: Within Functional Limits for tasks assessed              Home Living Family/patient expects to be discharged to:: Private residence Living Arrangements: Spouse/significant other Available Help at Discharge: Family;Available  24 hours/day  (finace works from home) Type of Home: House Home Access: Stairs to enter [employer] of Steps: 1 step in car port   Home Layout: One level     Bathroom Shower/Tub: Producer, television/film/video: Standard     Home Equipment: None    Prior Functioning/Environment Level of Independence: Independent     OT Diagnosis: Generalized weakness;Acute pain   OT Problem List:   N/a, no acute OT needs identified at this time     OT Treatment/Interventions:   N/a, no acute OT needs identified at this time     OT Goals(Current goals can be found in the care plan section) Acute Rehab OT Goals Patient Stated Goal: go home soon OT Goal Formulation: All assessment and education complete, DC therapy  OT Frequency:   N/a, no acute OT needs identified at this time     Barriers to D/C:  None known at this time        Co-evaluation PT/OT/SLP Co-Evaluation/Treatment: Yes Reason for Co-Treatment: For patient/therapist safety   OT goals addressed during session: ADL's and self-care;Strengthening/ROM;Other (comment) (functional mobility)      End of Session Equipment Utilized During Treatment: Rolling walker;Back brace  Activity Tolerance: Patient tolerated treatment well Patient left: in bed;with call bell/phone within reach;with family/visitor present   Time: 1025-1055 OT Time Calculation (min): 30 min Charges:  OT General Charges $OT Visit: 1 Procedure OT Evaluation $OT Eval Low Complexity: 1 Procedure OT Treatments $Self Care/Home Management : 8-22 mins  Edwin Cap , MS, OTR/L, CLT Pager: [phone]  07/25/2015, 11:24 AM

## 2015-07-25 NOTE — Progress Notes (Signed)
Inpatient Diabetes Program Recommendations  AACE/ADA: New Consensus Statement on Inpatient Glycemic Control (2015)  Target Ranges:  Prepandial:   less than 140 mg/dL      Peak postprandial:   less than 180 mg/dL (1-2 hours)      Critically ill patients:  140 - 180 mg/dL   Review of Glycemic Control  Diabetes history: None, New diagnosis this admission Current orders for Inpatient glycemic control: Lantus 5 units QHS, Novolog Sensitive TID + HS coverage  Inpatient Diabetes Program Recommendations:   Note A1c 9.1% this admission. Patient with elevated glucose levels but also received 10 mg of Decadron during surgery and has received 4 mg x 3 doses throughout the day yesterday. Unsure of baseline glucose. Preadmission testing lab glucose was in the 170's.   Patient will probably be controlled with Metformin and one other oral medication with lifestyle modifications. Will see patient today.  Thanks,  Christena DeemShannon Joleena Weisenburger RN, MSN, [facility] Inpatient Diabetes Coordinator Team Pager [phone] (8a-5p)

## 2015-07-25 NOTE — Progress Notes (Addendum)
Inpatient Diabetes Program Recommendations  AACE/ADA: New Consensus Statement on Inpatient Glycemic Control (2015)  Target Ranges:  Prepandial:   less than 140 mg/dL      Peak postprandial:   less than 180 mg/dL (1-2 hours)      Critically ill patients:  140 - 180 mg/dL   Spoke with patient about new diabetes diagnosis.  Discussed A1C results (9.1%) and explained what an A1C is and informed patient that his current A1C indicates an average glucose of 212 mg/dl over the past 2-3 months. Discussed basic pathophysiology of DM Type 2, basic home care, importance of checking CBGs and maintaining good CBG control to prevent long-term and short-term complications. Patient's son has DM type 1 and was diagnosed 2 years ago. Patient is upset by his PCP because he never checked him for DM with all of his family risk factors. Patient is very familiar with dietary changes. I gave supplemental information on carbohydrates. Patient drinks several ounces of regular Mountain dew throughout the day. Patient with diet modification, Metformin, and either Lantus or another oral medication is appropriate at discharge. Lantus would probably more appropriate and discussed with patient why. Patient will not begin therapy for another 6 weeks and will have low activity levels.   Reviewed Living Well with diabetes booklet and encouraged patient to read through entire book. Asked patient to check his glucose 2 times per day (before meals and alternating second check) and to keep a log book of glucose readings.  RNs to provide ongoing basic DM education at bedside with this patient and engage patient to actively check blood glucose and administer insulin injections.    MD at discharge patient will need: Glucose meter kit (order # [id number]) Metformin BID Lantus Solastar pen Insulin pen needles (order # [id number])  INPATIENT RECOMMENDATIONS: based on glucose of 300's, consider one time dose of Lantus 12-15 units to cover glucose  levels today. No further steroids ordered for today. Will recheck in the am for further dosing on basal for discharge.   Thanks, Tama Headings RN, MSN, [facility] Inpatient Diabetes Coordinator Team Pager [phone] (8a-5p)

## 2015-07-25 NOTE — Op Note (Signed)
NAMCaryl Bis:  Alen, Shaheim                 ACCOUNT NO.:  [id number]  MEDICAL RECORD NO.:  [id number]  LOCATION:  5M11C                        FACILITY:  [facility]  PHYSICIAN:  Mishawn Hemann D. Shon BatonBrooks, M.D. 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  OPERATIVE PROCEDURES:  Lateral interbody fusion at L3-4 using NuVasive Titanium intervertebral spacer, this is a 12 x 18 lordotic spacer packed with allograft bone and posterior segmental pedicle screw fixation and fusion at L3-4 using MIS NuVasive pedicle screw system.  COMPLICATIONS:  None.  CONDITION:  Stable.  FIRST ASSISTANT[facility]  [facility].  HISTORY:  This is a very pleasant 40 year old obese gentleman, who has been having progressive debilitating back, buttock and neuropathic right leg pain.  Attempts at conservative management for over the last year and a half to 2 years had failed to give him any significant relief. Because of the debilitating issues and failure of conservative management, we elected to proceed with surgery.  All appropriate risks, benefits, and alternatives were discussed with the patient and consent was obtained.  OPERATIVE NOTE:  The patient was brought to the operating room and placed supine on the operating table.  After successful induction of general anesthesia and endotracheal intubation, TEDs, SCDs, and a Foley were inserted.  All bony problems were well padded and the intraoperative EMG pads were applied to the skin.  The patient was turned into the lateral decubitus position (right side down).  Axillary roll was placed.  The peroneal nerve and ankle were also well padded. Two  pillows were placed in between the legs and the left upper extremity was placed in a well arm holder.  Once all bony prominences were well padded, the arms and legs were appropriately positioned.  Using x-ray, I identified the L3-4 level in both the AP and lateral planes and ensured I had proper positioning.  The patient was directly secured to the table with tape securing the legs and chest.  I then prepped and draped the back and left flank.  At this point, a time-out was taken to confirm patient, procedure, and all other pertinent important data.  Once this was completed, I used lateral fluoro to identify the anterior and posterior margins of the L3- 4 level.  I marked out an incision site infiltrated with 0.25% Marcaine. Incision was made, centered over the L3-4 disk space.  Sharp dissection was carried out down to the deep fascia.  The deep fascia was sharply incised and I dissected down to the fascia of the external oblique.  A small second incision was made 1 fingerbreadth posteriorly to this and I bluntly dissected down to the retroperitoneal fascia.  I then entered the retroperitoneal bluntly and then using my finger, dissected the retroperitoneal fat and then dissected from the undersurface of the oblique muscle.  I then bluntly dissected through the external oblique until I  could see my finger.  I then placed my first trocar down into the wound.  This was placed on the top of the psoas and then stimulated in 360-degree fashion.  Once I confirmed that I was not on top of the plexus, I then advanced the trocar through the psoas muscle down to the lateral aspect of the disk space.  I secured it to the disk space using a smaller guidepin and then circumferentially tested to ensure I was not traumatizing the plexus.  Once this was confirmed, I then sequentially dilated up and with each time, I was stimulated to ensure no compression or trauma to the plexus.  Once the final trocar  was in, I placed my working retractors.  I again stimulated in a 4-quadrant fashion and then used the probe to stimulate behind the blades.  I identified the location of the plexus posterior and inferior to my retractor system. At this point, the trocar was advanced through the medial blade and into the disk space.  This secured it in position.  I then pushed the retractor anteriorly so I had excellent view of the lateral disk space. I irrigated the wound with copious normal saline.  Once again, I confirmed that this was the L3-4 level in both the AP and lateral planes.  An annulotomy was performed using a 10-blade scalpel and then using Cobb curettes, pituitary rongeurs, and straight curettes, I removed the majority of the disk material.  I made sure not to violate the anterior longitudinal ligament.  I then made sure I released the contralateral annulus with my Cobb elevator.  I then irrigated the wound and made sure I could see bleeding subchondral bone all the way across the contralateral side.  Once this was done, I then trialed intervertebral spacers and I elected to use the size 12 lordotic by 18 cage.  This had excellent fit.  This also situated in the midportion of the disk space.  Provided excellent indirect foraminal decompression as well.  I then obtained the cage, packed it with the allograft bone and then malleted it to the appropriate depth.  At this point, I was pleased with my overall positioning of the cage.  I irrigated the wound copiously with normal saline, made sure I had hemostasis using bipolar electrocautery and FloSeal and then removed the retracting devices.  I then closed the fascia of the external oblique with a #1 STRATAFIX suture and then superficial 2-0 Vicryl sutures and a 3-0 Monocryl for the skin.  I also irrigated out my other incision and closed in a layered fashion with #1 Vicryl sutures, 2-0 Vicryl sutures and 3-0 Monocryl.  Steri-Strips and dry  dressings were applied.  Table was then leveled out and with the patient remaining in the lateral position, identified the lateral borders of the L3 and L4 pedicle, these were marked and incisions were made.  I advanced the Jamshidi needle percutaneously down to the lateral aspect of the facet complex.  I confirmed satisfactory position in both planes and then advanced the Jamshidi needle using live fluoro as well as neuromonitoring.  Once I was nearing the medial wall of the pedicle, I switched my view to the lateral view and confirmed that I was beyond the posterior wall of the vertebral body.  Once I did this, I advanced the Jamshidi needle into the vertebral body and placed my guidepin.  I repeated this exact procedure at L4 and at the L3-4 contralateral pedicle.  At this point, with  all four pedicles cannulated, I then tapped over the guidepin and again when I was tapping, I did also stimulate to ensure there was no breach.  I then placed at L3 45-mm length 6.5 diameter screws and at L4, 40-mm length 6.5-mm diameter screws.  All screws had excellent purchase. I then obtained two 45-mm length rods, placed them into the pedicle screw construct and secured them down with the appropriate locking nuts. All four knots were then torqued off according to the manufacture's standards.  It should be noted that after placing the each of the pedicle screws, I did directly stimulate the screw to ensure that there was no neurodiagnostic evidence of breach.  All screws tested above 40 mm.  At this point, with the rod secured, I irrigated the wounds, removed the retracting device and closed each of the four incisions with interrupted 2-0 Vicryl sutures and 3-0 Monocryl.  Steri-Strips and dry dressing were applied. The patient was ultimately extubated and transferred to the PACU without incident.  At the end of the case, all needle and sponge counts were correct.  There were no adverse intraoperative  events.     Brandley Aldrete D. Shon Baton, M.D.     DDB/MEDQ  D:  07/24/2015  T:  07/25/2015  Job:  [id number]

## 2015-07-25 NOTE — Care Management Note (Signed)
Case Management Note  Patient Details  Name: Troy Bush MRN: [id number] Date of Birth: 15-Oct-1975  Subjective/Objective:                    Action/Plan: Patient was admitted for a XLIF L3 - L4 1 LEVEL AND A POSTERIOR SPINAL FUSION INTERBODY L3 - L4.  Will follow for discharge needs pending PT/OT evals and physician orders.   Expected Discharge Date:                  Expected Discharge Plan:     In-House Referral:     Discharge planning Services     Post Acute Care Choice:    Choice offered to:     DME Arranged:    DME Agency:     HH Arranged:    HH Agency:     Status of Service:  In process, will continue to follow  Medicare Important Message Given:    Date Medicare IM Given:    Medicare IM give by:    Date Additional Medicare IM Given:    Additional Medicare Important Message give by:     If discussed at Long Length of Stay Meetings, dates discussed:    Additional CommentsAnda Kraft:  Leshea Jaggers C, RN 07/25/2015, 10:07 AM [phone]

## 2015-07-25 NOTE — Clinical Social Work Note (Signed)
CSW received referral for SNF.  Case discussed with case manager PT not recommending any follow up plan is to discharge home.  CSW to sign off please re-consult if social work needs arise.  Ervin KnackEric R. Maayan Jenning, MSW, [employer]WA [phone]

## 2015-07-25 NOTE — Progress Notes (Signed)
Patients Troponin show no elevation also patient needs order for CPAP, respiratory has set up but needs order to complete.

## 2015-07-25 NOTE — Evaluation (Signed)
Physical Therapy Evaluation & Discharge Patient Details Name: Troy Bush MRN: [id number] DOB: January 01, 1976 Today's Date: 07/25/2015   History of Present Illness  Patient was admitted for a XLIF L3 - L4 1 LEVEL AND A POSTERIOR SPINAL FUSION INTERBODY L3 - L4.PMH: HTN, dyspnea, DDD, chronic low back pain, Diverticular disease  Clinical Impression  Patient presents with supervision to mod I for mobility.  Educated on back precautions with mobility and all questions answered.  Given handout with instructions on precautions.  No further skilled PT needs at this time.     Follow Up Recommendations No PT follow up    Equipment Recommendations  None recommended by PT    Recommendations for Other Services       Precautions / Restrictions Precautions Precautions: Back;Fall Precaution Booklet Issued: Yes (comment) Required Braces or Orthoses: Spinal Brace Spinal Brace: Lumbar corset;Applied in sitting position Restrictions Weight Bearing Restrictions: No      Mobility  Bed Mobility Overal bed mobility: Modified Independent             General bed mobility comments: educated on technique and performed appropriately for supine to side to sit and sit to side to supine  Transfers Overall transfer level: Modified independent Equipment used: None             General transfer comment: Signed off patient for transfers, PT/OT left sign on door.   Ambulation/Gait Ambulation/Gait assistance: Modified independent (Device/Increase time);Supervision Ambulation Distance (Feet): 225 Feet Assistive device: Rolling walker (2 wheeled);None Gait Pattern/deviations: Step-through pattern;Decreased stride length;Wide base of support     General Gait Details: Started with walker, then without device and noted slight increased BOS; cues for abdominal bracing due to pt feeling like leaning back  Stairs Stairs: Yes Stairs assistance: Modified independent (Device/Increase time) Stair  Management: One rail Right;Step to pattern;Alternating pattern;Forwards Number of Stairs: 3 General stair comments: reports has wall beside steps can use if needed  Wheelchair Mobility    Modified Rankin (Stroke Patients Only)       Balance Overall balance assessment: No apparent balance deficits (not formally assessed)                                           Pertinent Vitals/Pain Pain Assessment: Faces Faces Pain Scale: Hurts little more Pain Location: back Pain Descriptors / Indicators: Sore Pain Intervention(s): Limited activity within patient's tolerance;Repositioned    Home Living Family/patient expects to be discharged to:: Private residence Living Arrangements: Spouse/significant other Available Help at Discharge: Family;Available 24 hours/day (fiance works from home) Type of Home: House Home Access: Stairs to enter Entrance Stairs-Rails: None Secretary/administratorntrance Stairs-Number of Steps: 1 into car port enclosed Home Layout: One level Home Equipment: None      Prior Function Level of Independence: Independent               Hand Dominance   Dominant Hand: Right    Extremity/Trunk Assessment   Upper Extremity Assessment: Overall WFL for tasks assessed           Lower Extremity Assessment: Overall WFL for tasks assessed      Cervical / Trunk Assessment: Normal  Communication   Communication: No difficulties  Cognition Arousal/Alertness: Awake/alert Behavior During Therapy: WFL for tasks assessed/performed Overall Cognitive Status: Within Functional Limits for tasks assessed  General Comments General comments (skin integrity, edema, etc.): Educated on car transfers     Exercises        Assessment/Plan    PT Assessment Patent does not need any further PT services  PT Diagnosis Difficulty walking   PT Problem List    PT Treatment Interventions     PT Goals (Current goals can be found in the Care  Plan section) Acute Rehab PT Goals Patient Stated Goal: go home soon PT Goal Formulation: All assessment and education complete, DC therapy    Frequency     Barriers to discharge        Co-evaluation PT/OT/SLP Co-Evaluation/Treatment: Yes Reason for Co-Treatment: For patient/therapist safety PT goals addressed during session: Mobility/safety with mobility;Other (comment) (back precaution education) OT goals addressed during session: ADL's and self-care;Strengthening/ROM;Other (comment) (functional mobility)       End of Session Equipment Utilized During Treatment: Back brace Activity Tolerance: Patient tolerated treatment well Patient left: in bed;with call bell/phone within reach      Functional Assessment Tool Used: Clinical Judgement Functional Limitation: Mobility: Walking and moving around Mobility: Walking and Moving Around Goal Status [phone]): 0 percent impaired, limited or restricted Mobility: Walking and Moving Around Discharge Status [phone]): 0 percent impaired, limited or restricted    Time: 1026-1055 PT Time Calculation (min) (ACUTE ONLY): 29 min   Charges:   PT Evaluation $PT Eval Moderate Complexity: 1 Procedure     PT G Codes:   PT G-Codes **NOT FOR INPATIENT CLASS** Functional Assessment Tool Used: Clinical Judgement Functional Limitation: Mobility: Walking and moving around Mobility: Walking and Moving Around Goal Status ([id number]): 0 percent impaired, limited or restricted Mobility: Walking and Moving Around Discharge Status [phone]): 0 percent impaired, limited or restricted    Elray Mcgregor 07/25/2015, 1:30 PM  Sheran Lawless, PT [phone] 07/25/2015

## 2015-07-25 NOTE — Progress Notes (Signed)
[facility] Consultation Progress Note  Patient: Troy Bush ZOX:[id number]   PCP: Newman Nipartledge, Michael H, PA-C DOB: 25-Aug-1975   DOA: 07/24/2015   DOS: 07/25/2015   Date of Service: the patient was seen and examined on 07/25/2015 Primary service: Venita Lickahari Brooks, MD   Subjective: Patient denies any acute complaint other than back pain. The pain has been well controlled with medications. Denies any nausea or vomiting no diarrhea or constipation no chest pain. No prior history of having diabetes mellitus. Family history of type 1 diabetes in his son.  Assessment and Plan: 1. Diabetes mellitus most likely type II. Uncontrolled with hemoglobin A1c 9.1. Patient did receive steroids perioperatively but that should not be able to explain the hemoglobin A1c elevation. I believe that the patient will benefit from being on insulin but as per recommendation on the diabetic coordinator the patient will be started on metformin and will continue with sliding scale insulin. We will begin the education of CBG monitoring as well as insulin injections. Patient will need to follow-up with PCP regarding long-term sugar management.  2. Postoperative junctional rhythm. Likely vagal response. Patient has been monitored on telemetry since procedure and I do not see any evidence of junctional rhythm or any other heart block or arrhythmia on telemetry. No further workup needed as the patient does not have any prior evidence of dizziness or syncope or chest pain or shortness of breath and currently remains asymptomatic.  3. Essential hypertension. Can resume Hyzaar today.  4. Sleep apnea. Compliant with CPAP but home. Continue CPAP here.   Pain management: Per primary team Activity: physical therapy consult pending Bowel regimen: last BM prior to admission Diet: Carb modified heart healthy diet DVT Prophylaxis: mechanical compression device  Family Communication: family was present at bedside, at the time  of interview. The pt provided permission to discuss medical plan with the family. Opportunity was given to ask question and all questions were answered satisfactorily.   Disposition: We will continue to follow the patient.   Recommendation on discharge: Recheck hemoglobin A1c in 3 months. Follow-up with PCP in one week with blood sugar logs. Check CBG 4 times a day.  Brief Summary of Hospitalization:  Procedures: XLIF L3 - L4 1 LEVEL AND A POSTERIOR SPINAL FUSION INTERBODY L3 - L4 (N/A) 07/24/2015  Antibiotics: Anti-infectives    Start     Dose/Rate Route Frequency Ordered Stop   07/24/15 2000  ceFAZolin (ANCEF) IVPB 1 g/50 mL premix     1 g 100 mL/hr over 30 Minutes Intravenous Every 8 hours 07/24/15 1707 07/25/15 0436   07/24/15 1215  ceFAZolin (ANCEF) 3 g in dextrose 5 % 50 mL IVPB  Status:  Discontinued     3 g 130 mL/hr over 30 Minutes Intravenous  Once 07/24/15 1203 07/24/15 1648   07/24/15 0709  ceFAZolin (ANCEF) IVPB 2g/100 mL premix     2 g 200 mL/hr over 30 Minutes Intravenous 30 min pre-op 07/24/15 0710 07/24/15 1238   07/24/15 0627  ceFAZolin (ANCEF) 2-4 GM/100ML-% IVPB    Comments:  Edwina BarthHawks, Pamela   : cabinet override      07/24/15 0627 07/24/15 1844      Intake/Output Summary (Last 24 hours) at 07/25/15 1220 Last data filed at 07/25/15 0503  Gross per 24 hour  Intake    443 ml  Output   1950 ml  Net  -1507 ml   Filed Weights   07/24/15 0651  Weight: 116.121 kg (256 lb)    Objective:  Physical Exam: Filed Vitals:   07/24/15 2139 07/25/15 0101 07/25/15 0503 07/25/15 1010  BP: 120/60 111/52 114/53 139/69  Pulse: 78 57 65 74  Temp: 99.3 F (37.4 C) 98.6 F (37 C) 98.3 F (36.8 C) 98.5 F (36.9 C)  TempSrc: Oral Oral Oral Oral  Resp: 18 18 18 18   Height:      Weight:      SpO2: 95% 95% 95% 96%   General: Alert, Awake and Oriented to Time, Place and Person. Appear in mild distress Eyes: PERRL, Conjunctiva normal ENT: Oral Mucosa clear moist. Neck:  difficult to assess  JVD, no Abnormal Mass Or lumps Cardiovascular: S1 and S2 Present, no Murmur, Peripheral Pulses Present Respiratory: Bilateral Air entry equal and Decreased,  Clear to Auscultation, no Crackles, no wheezes Abdomen: Bowel Sound present, Soft and no tenderness Skin: redness no, no Rash  Extremities: no Pedal edema, no calf tenderness Neurologic: Grossly no focal neuro deficit.Bilaterally Equal motor strength  Data Reviewed: CBC:  Recent Labs Lab 07/18/15 1453  WBC 7.6  HGB 15.4  HCT 44.9  MCV 85.4  PLT 214   Basic Metabolic Panel:  Recent Labs Lab 07/18/15 1453 07/24/15 1402  NA 137 136  K 3.5 3.9  CL 102 103  CO2 24 23  GLUCOSE 175* 277*  BUN 9 8  CREATININE 0.94 0.98  CALCIUM 9.5 8.8*  MG  --  1.8   Liver Function Tests: No results for input(s): AST, ALT, ALKPHOS, BILITOT, PROT, ALBUMIN in the last 168 hours. No results for input(s): LIPASE, AMYLASE in the last 168 hours. No results for input(s): AMMONIA in the last 168 hours.  Cardiac Enzymes:  Recent Labs Lab 07/24/15 1402 07/24/15 1853 07/25/15 0104 07/25/15 0950  TROPONINI <0.03 <0.03 <0.03 <0.03   BNP (last 3 results) No results for input(s): BNP in the last 8760 hours.  CBG:  Recent Labs Lab 07/25/15 1148  GLUCAP 336*    Recent Results (from the past 240 hour(s))  Surgical pcr screen     Status: None   Collection Time: 07/18/15  2:53 PM  Result Value Ref Range Status   MRSA, PCR NEGATIVE NEGATIVE Final   Staphylococcus aureus NEGATIVE NEGATIVE Final    Comment:        The Xpert SA Assay (FDA approved for NASAL specimens in patients over 23 years of age), is one component of a comprehensive surveillance program.  Test performance has been validated by [facility] for patients greater than or equal to 99 year old. It is not intended to diagnose infection nor to guide or monitor treatment.      Studies: Dg Lumbar Spine 2-3 Views  07/25/2015  CLINICAL DATA:   Low back pain, history of lumbar fusion yesterday EXAM: LUMBAR SPINE - 2 VIEW COMPARISON:  Films from the previous day. FINDINGS: Pedicle screws are again seen at L3 and L4 with posterior fixation. Interbody fusion is noted. The overall appearance is stable from the previous day. No acute bony abnormality is seen. No soft tissue abnormality is noted. IMPRESSION: Status post fusion at L3-4 without significant interval change. Electronically Signed   By: Alcide Clever M.D.   On: 07/25/2015 07:43   Dg Lumbar Spine 2-3 Views  07/24/2015  CLINICAL DATA:  Surgical posterior fusion of L3-4. EXAM: LUMBAR SPINE - 2-3 VIEW; DG C-ARM GT 120 MIN FLUOROSCOPY TIME:  5 minutes 34 seconds. COMPARISON:  None. FINDINGS: Two intraoperative fluoroscopic images of the lower lumbar spine demonstrate the patient be  status post surgical posterior fusion of L3-4 with interbody fusion. Good alignment of vertebral bodies is noted. IMPRESSION: Status post surgical posterior fusion of L3-4. Electronically Signed   By: Lupita Raider, M.D.   On: 07/24/2015 12:31   Dg C-arm Gt 120 Min  07/24/2015  CLINICAL DATA:  Surgical posterior fusion of L3-4. EXAM: LUMBAR SPINE - 2-3 VIEW; DG C-ARM GT 120 MIN FLUOROSCOPY TIME:  5 minutes 34 seconds. COMPARISON:  None. FINDINGS: Two intraoperative fluoroscopic images of the lower lumbar spine demonstrate the patient be status post surgical posterior fusion of L3-4 with interbody fusion. Good alignment of vertebral bodies is noted. IMPRESSION: Status post surgical posterior fusion of L3-4. Electronically Signed   By: Lupita Raider, M.D.   On: 07/24/2015 12:31     Scheduled Meds: . losartan  100 mg Oral Daily   And  . hydrochlorothiazide  12.5 mg Oral Daily  . insulin aspart  0-5 Units Subcutaneous QHS  . insulin aspart  0-9 Units Subcutaneous TID WC  . insulin glargine  5 Units Subcutaneous QHS  . living well with diabetes book   Does not apply Once  . sodium chloride flush  3 mL Intravenous  Q12H  . sodium phosphate  1 enema Rectal Once   Continuous Infusions: . sodium chloride Stopped (07/24/15 1955)  . lactated ringers 1,000 mL (07/24/15 1956)   PRN Meds: menthol-cetylpyridinium **OR** phenol, methocarbamol **OR** methocarbamol (ROBAXIN)  IV, morphine injection, ondansetron (ZOFRAN) IV, oxyCODONE, sodium chloride flush  Time spent: 30 minutes  Author: Lynden Oxford, MD [facility] Pager: [phone] 07/25/2015 12:20 PM  If 7PM-7AM, please contact night-coverage at [email or web address], password [facility]

## 2015-07-25 NOTE — Progress Notes (Signed)
Patient ID: Troy Bush, male   DOB: Sep 11, 1975, 40 y.o.   MRN: [id number]    Subjective: 1 Day Post-Op Procedure(s) (LRB): XLIF L3 - L4 1 LEVEL AND A POSTERIOR SPINAL FUSION INTERBODY L3 - L4 (N/A) Patient reports pain as 7 on 0-10 scale.   Denies CP or SOB.  Foley just removed.  Has no voided yet. Positive flatus. Objective: Vital signs in last 24 hours: Temp:  [98.1 F (36.7 C)-99.3 F (37.4 C)] 98.3 F (36.8 C) (05/11 0503) Pulse Rate:  [57-78] 65 (05/11 0503) Resp:  [16-19] 18 (05/11 0503) BP: (102-123)/(48-76) 114/53 mmHg (05/11 0503) SpO2:  [95 %-100 %] 95 % (05/11 0503)  Intake/Output from previous day: 05/10 0701 - 05/11 0700 In: 2443 [P.O.:240; I.V.:2203] Out: 2475 [Urine:2225; Blood:250] Intake/Output this shift:    Labs: No results for input(s): HGB in the last 72 hours. No results for input(s): WBC, RBC, HCT, PLT in the last 72 hours.  Recent Labs  07/24/15 1402  NA 136  K 3.9  CL 103  CO2 23  BUN 8  CREATININE 0.98  GLUCOSE 277*  CALCIUM 8.8*   No results for input(s): LABPT, INR in the last 72 hours.  Physical Exam: Neurologically intact ABD soft Sensation intact distally Dorsiflexion/Plantar flexion intact Incision: scant drainage Compartment soft  Assessment/Plan: 1 Day Post-Op Procedure(s) (LRB): XLIF L3 - L4 1 LEVEL AND A POSTERIOR SPINAL FUSION INTERBODY L3 - L4 (N/A) Advance diet Up with therapy  Asked nurse to administer pain medication   Mayo, Baxter Kailarmen Christina for Dr. Venita Lickahari Siyah Mault [facility] [phone] 07/25/2015, 7:40 AM

## 2015-07-26 LAB — BASIC METABOLIC PANEL
Anion gap: 11 (ref 5–15)
BUN: 15 mg/dL (ref 6–20)
CALCIUM: 8.9 mg/dL (ref 8.9–10.3)
CO2: 29 mmol/L (ref 22–32)
CREATININE: 0.81 mg/dL (ref 0.61–1.24)
Chloride: 98 mmol/L — ABNORMAL LOW (ref 101–111)
GFR calc Af Amer: >60 mL/min (ref >60)
Glucose, Bld: 179 mg/dL — ABNORMAL HIGH (ref 65–99)
Potassium: 4 mmol/L (ref 3.5–5.1)
SODIUM: 138 mmol/L (ref 135–145)

## 2015-07-26 LAB — GLUCOSE, CAPILLARY: GLUCOSE-CAPILLARY: 172 mg/dL — AB (ref 65–99)

## 2015-07-26 LAB — TROPONIN I: Troponin I: <0.03 ng/mL (ref <0.031)

## 2015-07-26 MED ORDER — BLOOD GLUCOSE METER KIT: PACK | Status: AC

## 2015-07-26 MED ORDER — INSULIN GLARGINE 100 UNIT/ML SOLOSTAR PEN: 5.0000 [IU] | PEN_INJECTOR | Freq: Every day | SUBCUTANEOUS | Status: AC

## 2015-07-26 MED ORDER — INSULIN PEN NEEDLE 31G X 5 MM MISC: 1.0000 | Freq: Three times a day (TID) | Status: AC

## 2015-07-26 MED ORDER — METFORMIN HCL 500 MG PO TABS: 500.0000 mg | ORAL_TABLET | Freq: Two times a day (BID) | ORAL | Status: AC

## 2015-07-26 MED ORDER — METFORMIN HCL 500 MG PO TABS
500.0000 mg | ORAL_TABLET | Freq: Two times a day (BID) | ORAL | Status: DC
  Administered 2015-07-26: 500 mg via ORAL
  Filled 2015-07-26: qty 1

## 2015-07-26 NOTE — Progress Notes (Signed)
[facility] Consultation Progress Note  Patient: Troy Bush ZOX:[id number]   PCP: Newman Nip, PA-C DOB: [birth year]   DOA: 07/24/2015   DOS: 07/26/2015   Date of Service: the patient was seen and examined on 07/26/2015 Primary service: Venita Lick, MD   Subjective: pt denies any acute complains. Pain is well controlled. No dizziness lightheadedness,  Assessment and Plan: 1. Diabetes mellitus most likely type II. Uncontrolled with hemoglobin A1c 9.1. Patient did receive steroids perioperatively but that should not be able to explain the hemoglobin A1c elevation. I believe that the patient will benefit from being on insulin. Patient will need to follow-up with PCP regarding long-term sugar management. Metformin 500 mg added.  2. Postoperative junctional rhythm. Likely vagal response. Patient has been monitored on telemetry since procedure and I do not see any evidence of junctional rhythm or any other heart block or arrhythmia on telemetry. No further workup needed as the patient does not have any prior evidence of dizziness or syncope or chest pain or shortness of breath and currently remains asymptomatic. Overnight pt has been bradycardia but when awake his heart rate is more then 70 and pt remains asymptomatic.  3. Essential hypertension. Continue hyzaar  4. Sleep apnea. Compliant with CPAP but home.   Pain management: Per primary team Activity: physical therapy  Bowel regimen: last BM prior to admission Diet: Carb modified heart healthy diet DVT Prophylaxis: mechanical compression device  Family Communication: family was present at bedside, at the time of interview. The pt provided permission to discuss medical plan with the family. Opportunity was given to ask question and all questions were answered satisfactorily.   Disposition: We will SIGNOFF  Pt can be discharged home from medical perspective   Recommendation on discharge: Recheck hemoglobin A1c in 3  months. Follow-up with PCP in one week with blood sugar logs. Check CBG 4 times a day.  Brief Summary of Hospitalization:  Procedures: XLIF L3 - L4 1 LEVEL AND A POSTERIOR SPINAL FUSION INTERBODY L3 - L4 (N/A) 07/24/2015  Antibiotics: Anti-infectives    Start     Dose/Rate Route Frequency Ordered Stop   07/24/15 2000  ceFAZolin (ANCEF) IVPB 1 g/50 mL premix     1 g 100 mL/hr over 30 Minutes Intravenous Every 8 hours 07/24/15 1707 07/25/15 0436   07/24/15 1215  ceFAZolin (ANCEF) 3 g in dextrose 5 % 50 mL IVPB  Status:  Discontinued     3 g 130 mL/hr over 30 Minutes Intravenous  Once 07/24/15 1203 07/24/15 1648   07/24/15 0709  ceFAZolin (ANCEF) IVPB 2g/100 mL premix     2 g 200 mL/hr over 30 Minutes Intravenous 30 min pre-op 07/24/15 0710 07/24/15 1238   07/24/15 0627  ceFAZolin (ANCEF) 2-4 GM/100ML-% IVPB    Comments:  Edwina Barth   : cabinet override      07/24/15 0627 07/24/15 1844      Intake/Output Summary (Last 24 hours) at 07/26/15 0817 Last data filed at 07/25/15 2229  Gross per 24 hour  Intake      3 ml  Output      0 ml  Net      3 ml   Filed Weights   07/24/15 0651  Weight: 116.121 kg (256 lb)    Objective: Physical Exam: Filed Vitals:   07/25/15 1402 07/25/15 2120 07/26/15 0124 07/26/15 0644  BP: 133/63 133/57 138/68 122/61  Pulse: 67 54 57 63  Temp: 97.9 F (36.6 C) 98 F (36.7 C) 98.4  F (36.9 C) 97.4 F (36.3 C)  TempSrc: Oral Oral Oral Oral  Resp: 18 16 18 18   Height:      Weight:      SpO2: 93% 95% 97% 98%   General: Alert, Awake and Oriented to Time, Place and Person. Appear in mild distress Eyes: PERRL, Conjunctiva normal ENT: Oral Mucosa clear moist. Neck: difficult to assess  JVD, no Abnormal Mass Or lumps Cardiovascular: S1 and S2 Present, no Murmur, Peripheral Pulses Present Respiratory: Bilateral Air entry equal and Decreased,  Clear to Auscultation, no Crackles, no wheezes Abdomen: Bowel Sound present, Soft and no  tenderness Skin: redness no, no Rash   Data Reviewed: CBC: No results for input(s): WBC, NEUTROABS, HGB, HCT, MCV, PLT in the last 168 hours. Basic Metabolic Panel:  Recent Labs Lab 07/24/15 1402 07/26/15 0632  NA 136 138  K 3.9 4.0  CL 103 98*  CO2 23 29  GLUCOSE 277* 179*  BUN 8 15  CREATININE 0.98 0.81  CALCIUM 8.8* 8.9  MG 1.8  --    Liver Function Tests: No results for input(s): AST, ALT, ALKPHOS, BILITOT, PROT, ALBUMIN in the last 168 hours. No results for input(s): LIPASE, AMYLASE in the last 168 hours. No results for input(s): AMMONIA in the last 168 hours.  Cardiac Enzymes:  Recent Labs Lab 07/25/15 0950 07/25/15 1308 07/25/15 1933 07/26/15 0110 07/26/15 0632  TROPONINI <0.03 <0.03 <0.03 <0.03 <0.03   BNP (last 3 results) No results for input(s): BNP in the last 8760 hours.  CBG:  Recent Labs Lab 07/25/15 1148 07/25/15 1732 07/25/15 2156 07/26/15 0635  GLUCAP 336* 208* 249* 172*    Recent Results (from the past 240 hour(s))  Surgical pcr screen     Status: None   Collection Time: 07/18/15  2:53 PM  Result Value Ref Range Status   MRSA, PCR NEGATIVE NEGATIVE Final   Staphylococcus aureus NEGATIVE NEGATIVE Final    Comment:        The Xpert SA Assay (FDA approved for NASAL specimens in patients over 42 years of age), is one component of a comprehensive surveillance program.  Test performance has been validated by [facility] for patients greater than or equal to 42 year old. It is not intended to diagnose infection nor to guide or monitor treatment.      Studies: No results found.   Scheduled Meds: . losartan  100 mg Oral Daily   And  . hydrochlorothiazide  12.5 mg Oral Daily  . insulin aspart  0-5 Units Subcutaneous QHS  . insulin aspart  0-9 Units Subcutaneous TID WC  . insulin glargine  5 Units Subcutaneous QHS  . metFORMIN  500 mg Oral BID WC  . sodium chloride flush  3 mL Intravenous Q12H  . sodium phosphate  1 enema  Rectal Once   Continuous Infusions: . sodium chloride Stopped (07/24/15 1955)  . lactated ringers 1,000 mL (07/24/15 1956)   PRN Meds: menthol-cetylpyridinium **OR** phenol, methocarbamol **OR** methocarbamol (ROBAXIN)  IV, morphine injection, ondansetron (ZOFRAN) IV, oxyCODONE, sodium chloride flush  Time spent: 30 minutes  Author: Lynden OxfordPranav Yong Wahlquist, MD [facility] Pager: [phone] 07/26/2015 8:17 AM  If 7PM-7AM, please contact night-coverage at [email or web address], password [email or web address]

## 2015-07-26 NOTE — Care Management Note (Signed)
Case Management Note  Patient Details  Name: Troy Bush MRN: [id number] Date of Birth: 1975/08/18  Subjective/Objective:                    Action/Plan: Patient discharging home with orders for 3 in 1. CM met with the patient and he states he needs the DME. Jermaine with [facility] notified and will deliver the equipment to the room. Bedside RN updated.   Expected Discharge Date:                  Expected Discharge Plan:  Home/Self Care  In-House Referral:     Discharge planning Services  CM Consult  Post Acute Care Choice:  Durable Medical Equipment Choice offered to:  Patient  DME Arranged:  3-N-1 DME Agency:  [place]:    [place]:     Status of Service:  Completed, signed off  Medicare Important Message Given:    Date Medicare IM Given:    Medicare IM give by:    Date Additional Medicare IM Given:    Additional Medicare Important Message give by:     If discussed at [place] of Stay Meetings, dates discussed:    Additional Comments:  Pollie Friar, RN 07/26/2015, 10:32 AM

## 2015-07-26 NOTE — Progress Notes (Signed)
    Subjective: Procedure(s) (LRB): XLIF L3 - L4 1 LEVEL AND A POSTERIOR SPINAL FUSION INTERBODY L3 - L4 (N/A) 2 Days Post-Op  Patient reports pain as 2 on 0-10 scale.  Reports decreased leg pain reports incisional back pain   Positive void Negative bowel movement Positive flatus Negative chest pain or shortness of breath  Objective: Vital signs in last 24 hours: Temp:  [97.4 F (36.3 C)-98.5 F (36.9 C)] 97.4 F (36.3 C) (05/12 0644) Pulse Rate:  [54-74] 63 (05/12 0644) Resp:  [16-18] 18 (05/12 0644) BP: (122-139)/(57-69) 122/61 mmHg (05/12 0644) SpO2:  [93 %-98 %] 98 % (05/12 0644)  Intake/Output from previous day: 05/11 0701 - 05/12 0700 In: 3 [I.V.:3] Out: -   Labs: No results for input(s): WBC, RBC, HCT, PLT in the last 72 hours.  Recent Labs  07/24/15 1402 07/26/15 0632  NA 136 138  K 3.9 4.0  CL 103 98*  CO2 23 29  BUN 8 15  CREATININE 0.98 0.81  GLUCOSE 277* 179*  CALCIUM 8.8* 8.9   No results for input(s): LABPT, INR in the last 72 hours.  Physical Exam: Neurologically intact ABD soft Intact pulses distally Incision: dressing C/D/I and no drainage Compartment soft  Assessment/Plan: Patient stable  xrays satisfactory Continue mobilization with physical therapy Continue care  Advance diet Up with therapy  Doing well No hip flexor weakness Plan on d/c to home Cleared from medical standpoint Will have him f/u with PCP about DM  Venita Lickahari Abagail Limb, MD [facility] [phone]

## 2015-07-26 NOTE — Progress Notes (Signed)
Pt for discharge home today. Discharge orders received. IV and telemetry dcd with dressing clean dry and intact to lower back. Discharge instructions and prescriptions given with verbalized understanding. Family at bedside to assist with discharge. Staff brought patient to lobby via wheelchair at 1050. Transported to home by family member.

## 2015-08-26 NOTE — Discharge Summary (Signed)
Physician Discharge Summary  Patient ID: Troy Bush MRN: [id number] DOB/AGE: 1976-03-09 40 y.o.  Admit date: 07/24/2015 Discharge date: 08/26/2015  Admission Diagnoses:  DDD Lumbar and radicular RLE pain  Discharge Diagnoses:  Active Problems:   Back pain   Essential hypertension   Sleep apnea   Hyperglycemia   Junctional rhythm   Diabetes mellitus ([place]) most likely type II uncontrolled   Past Medical History  Diagnosis Date  . Hypertension   . Shortness of breath dyspnea     with exertion  . Diverticular disease   . DDD (degenerative disc disease), lumbar   . OSA on CPAP   . Chronic lower back pain     Surgeries: Procedure(s): XLIF L3 - L4 1 LEVEL AND A POSTERIOR SPINAL FUSION INTERBODY L3 - L4 on 07/24/2015   Consultants (if any): Treatment Team:  Marcheta Grammes, MD  Discharged Condition: Improved  Hospital Course: Troy Bush is an 40 y.o. male who was admitted 07/24/2015 with a diagnosis of Lumbar DDD and RLE pain and went to the operating room on 07/24/2015 and underwent the above named procedures.  Pt discharged on 07/26/15.  He was given perioperative antibiotics:  Anti-infectives    Start     Dose/Rate Route Frequency Ordered Stop   07/24/15 2000  ceFAZolin (ANCEF) IVPB 1 g/50 mL premix     1 g 100 mL/hr over 30 Minutes Intravenous Every 8 hours 07/24/15 1707 07/25/15 0436   07/24/15 1215  ceFAZolin (ANCEF) 3 g in dextrose 5 % 50 mL IVPB  Status:  Discontinued     3 g 130 mL/hr over 30 Minutes Intravenous  Once 07/24/15 1203 07/24/15 1648   07/24/15 0709  ceFAZolin (ANCEF) IVPB 2g/100 mL premix     2 g 200 mL/hr over 30 Minutes Intravenous 30 min pre-op 07/24/15 0710 07/24/15 1238   07/24/15 0627  ceFAZolin (ANCEF) 2-4 GM/100ML-% IVPB    Comments:  Maryjean Ka   : cabinet override      07/24/15 0627 07/24/15 1844    .  He was given sequential compression devices, early ambulation, and TED for DVT prophylaxis.  He benefited maximally from the hospital  stay and there were no complications.    Recent vital signs:  Filed Vitals:   07/26/15 0644 07/26/15 0908  BP: 122/61 108/58  Pulse: 63 54  Temp: 97.4 F (36.3 C) 98.2 F (36.8 C)  Resp: 18 16    Recent laboratory studies:  Lab Results  Component Value Date   HGB 15.4 07/18/2015   Lab Results  Component Value Date   WBC 7.6 07/18/2015   PLT 214 07/18/2015   No results found for: INR Lab Results  Component Value Date   NA 138 07/26/2015   K 4.0 07/26/2015   CL 98* 07/26/2015   CO2 29 07/26/2015   BUN 15 07/26/2015   CREATININE 0.81 07/26/2015   GLUCOSE 179* 07/26/2015    Discharge Medications:     Medication List    STOP taking these medications        baclofen 10 MG tablet  Commonly known as:  LIORESAL      TAKE these medications        aspirin EC 81 MG tablet  Take 1 tablet (81 mg total) by mouth daily. Start on POD #2     blood glucose meter kit and supplies  Dispense based on patient and insurance preference. Use up to four times daily as directed. (FOR ICD-9 250.00, 250.01).  docusate sodium 100 MG capsule  Commonly known as:  COLACE  Take 1 capsule (100 mg total) by mouth 3 (three) times daily as needed for mild constipation.     Insulin Glargine 100 UNIT/ML Solostar Pen  Commonly known as:  LANTUS SOLOSTAR  Inject 5 Units into the skin daily at 10 pm.     Insulin Pen Needle 31G X 5 MM Misc  1 each by Does not apply route 4 (four) times daily -  before meals and at bedtime.     losartan-hydrochlorothiazide 100-12.5 MG tablet  Commonly known as:  HYZAAR  Take 1 tablet by mouth daily.     metFORMIN 500 MG tablet  Commonly known as:  GLUCOPHAGE  Take 1 tablet (500 mg total) by mouth 2 (two) times daily with a meal.     methocarbamol 500 MG tablet  Commonly known as:  ROBAXIN  Take 1 tablet (500 mg total) by mouth 3 (three) times daily as needed for muscle spasms.     ondansetron 4 MG tablet  Commonly known as:  ZOFRAN  Take 1 tablet  (4 mg total) by mouth every 8 (eight) hours as needed for nausea or vomiting.     oxyCODONE-acetaminophen 10-325 MG tablet  Commonly known as:  PERCOCET  Take 1 tablet by mouth every 4 (four) hours as needed for pain.        Diagnostic Studies: No results found.  Disposition: 01-Home or Self Care      Discharge Instructions    Diet Carb Modified    Complete by:  As directed      Discharge instructions    Complete by:  As directed   It is important that you read following instructions as well as go over your medication list with RN to help you understand your care after this hospitalization.  Discharge Instructions: Recheck hemoglobin A1c in 3 months. Follow-up with PCP in one week with blood sugar logs. Check CBG 4 times a day.  Please request your primary care physician to go over all Hospital Tests and Procedure/Radiological results at the follow up,  Please get all Hospital records sent to your PCP by signing hospital release before you go home.   Do not take more than prescribed Pain, Sleep and Anxiety Medications. You were cared for by a hospitalist during your hospital stay. If you have any questions about your discharge medications or the care you received while you were in the hospital after you are discharged, you can call the unit and ask to speak with the hospitalist on call if the hospitalist that took care of you is not available.  Once you are discharged, your primary care physician will handle any further medical issues. Please note that NO REFILLS for any discharge medications will be authorized once you are discharged, as it is imperative that you return to your primary care physician (or establish a relationship with a primary care physician if you do not have one) for your aftercare needs so that they can reassess your need for medications and monitor your lab values. You Must read complete instructions/literature along with all the possible adverse reactions/side  effects for all the Medicines you take and that have been prescribed to you. Take any new Medicines after you have completely understood and accept all the possible adverse reactions/side effects. Wear Seat belts while driving. If you have smoked or chewed Tobacco in the last 2 yrs please stop smoking and/or stop any Recreational drug use.  Follow-up Information    Follow up with Dahlia Bailiff, MD. Schedule an appointment as soon as possible for a visit in 2 weeks.   Specialty:  Orthopedic Surgery   Why:  If symptoms worsen, For suture removal, For wound re-check   Contact information:   [address] [place] [address] [phone]        Signed: Valinda Hoar 08/26/2015, 1:29 PM

## 2017-04-23 IMAGING — RF DG C-ARM GT 120 MIN
1 series · 2 of 2 positions shown · non-contrast
Comparison: None.

CLINICAL DATA: Surgical posterior fusion of L3-4.

EXAM:
LUMBAR SPINE - 2-3 VIEW; DG C-ARM GT 120 MIN
FLUOROSCOPY TIME:  5 minutes 34 seconds.

[Series 1: run · 2 of 2 slices shown]
[im 1/2]
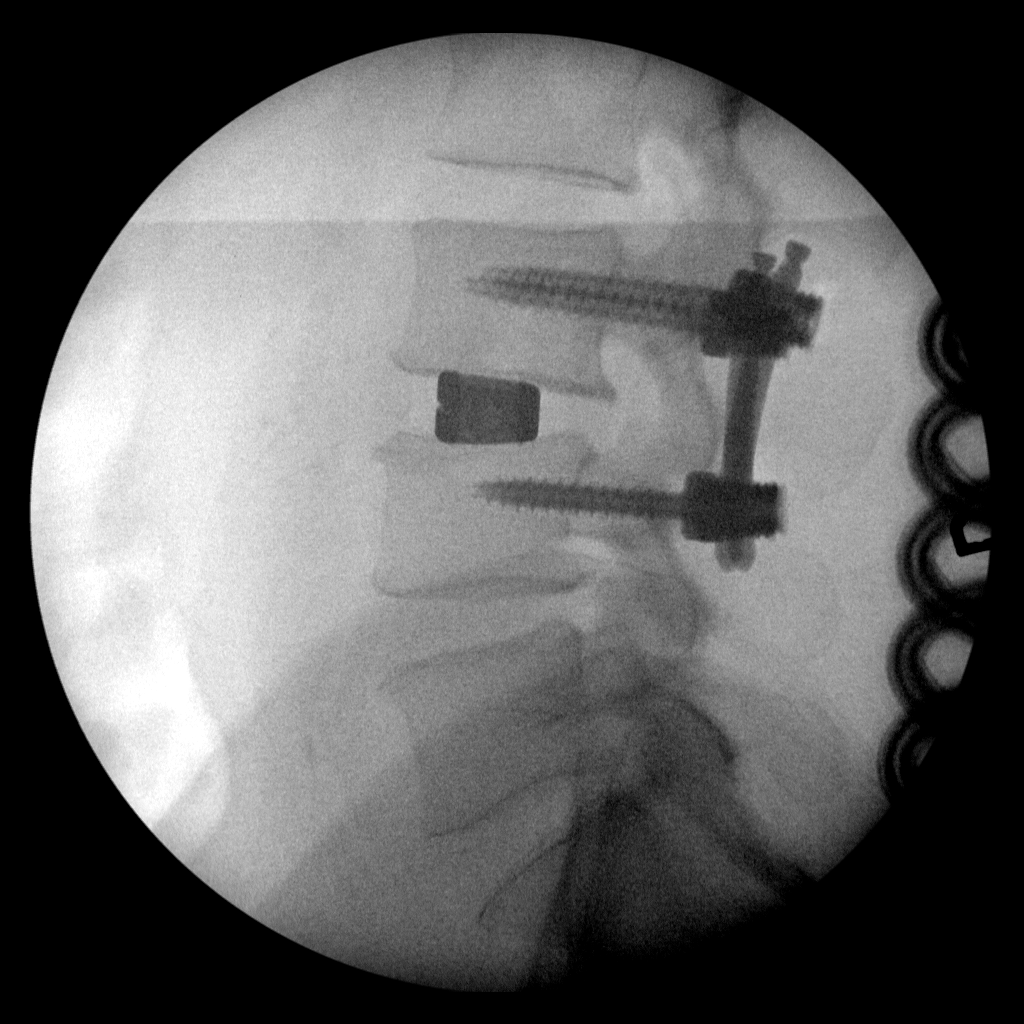
[im 2/2]
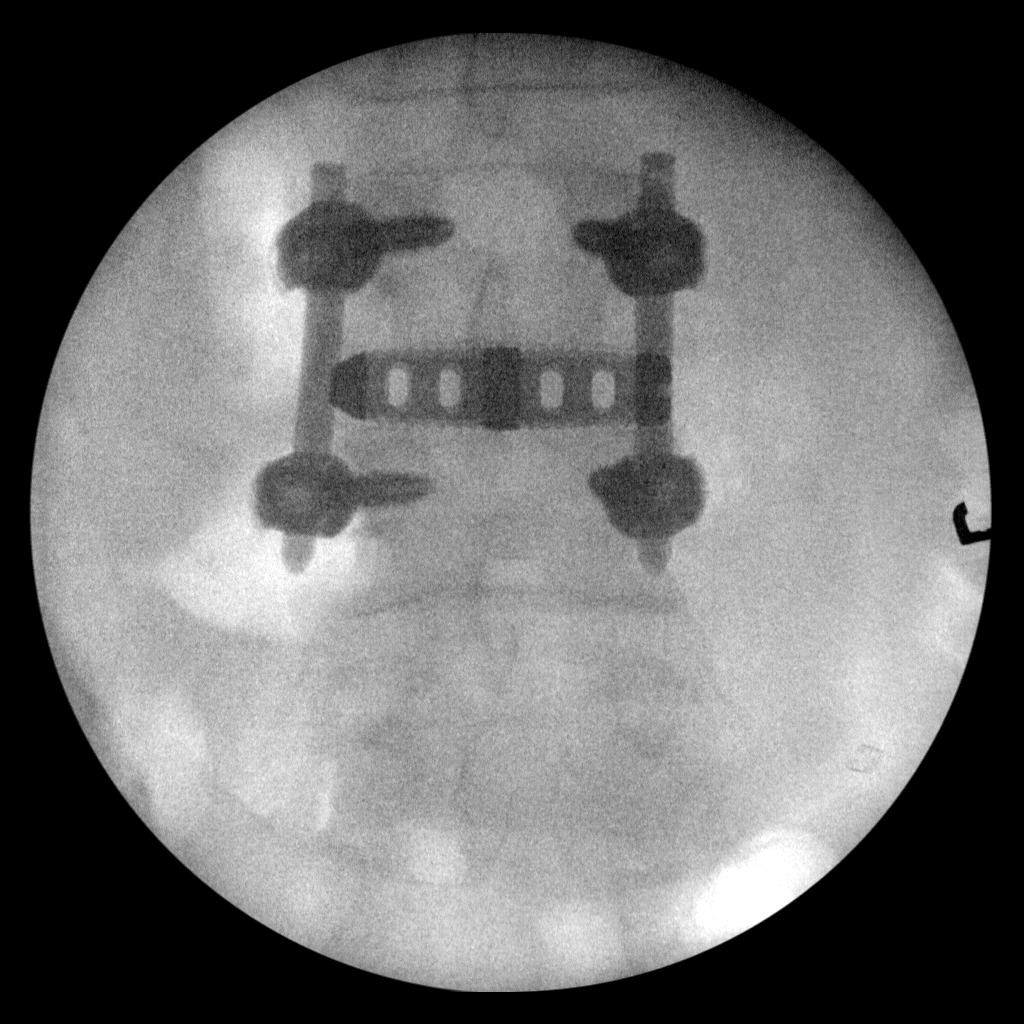

[2 of 2 positions shown; findings below may reference images not displayed]

FINDINGS: Two intraoperative fluoroscopic images of the lower lumbar spine
demonstrate the patient be status post surgical posterior fusion of
L3-4 with interbody fusion. Good alignment of vertebral bodies is
noted.
IMPRESSION: Status post surgical posterior fusion of L3-4.

## 2017-04-24 IMAGING — DX DG LUMBAR SPINE 2-3V
2 series · 2 of 2 positions shown · non-contrast
Comparison: Films from the previous day.

CLINICAL DATA: Low back pain, history of lumbar fusion yesterday

EXAM:
LUMBAR SPINE - 2 VIEW

[t lumbar spine ap]
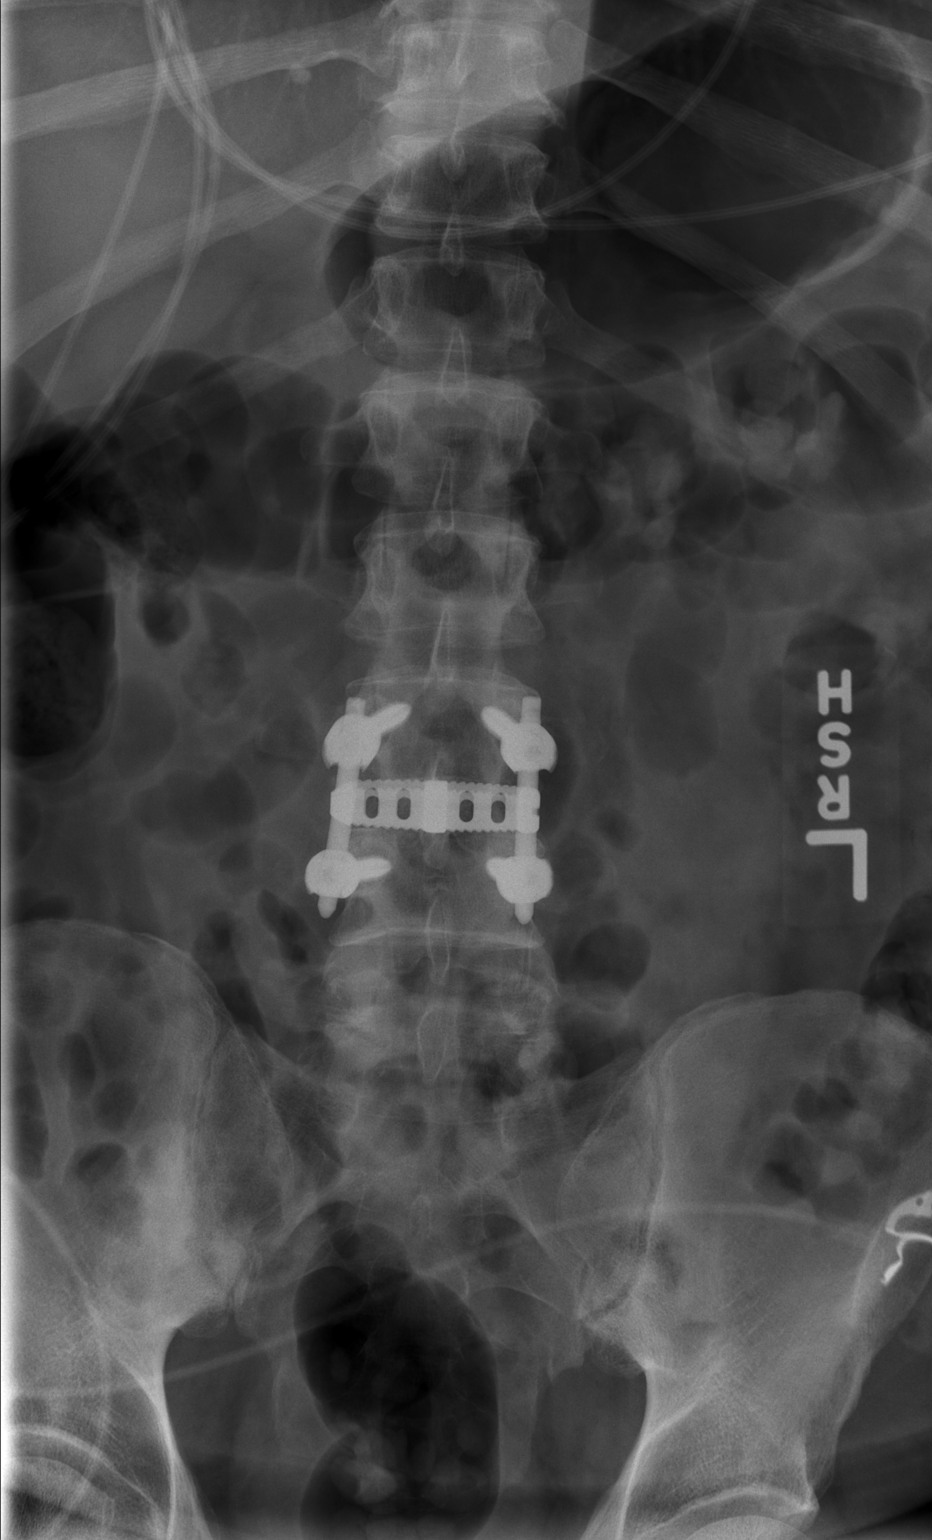

[t lumbar spine lat]
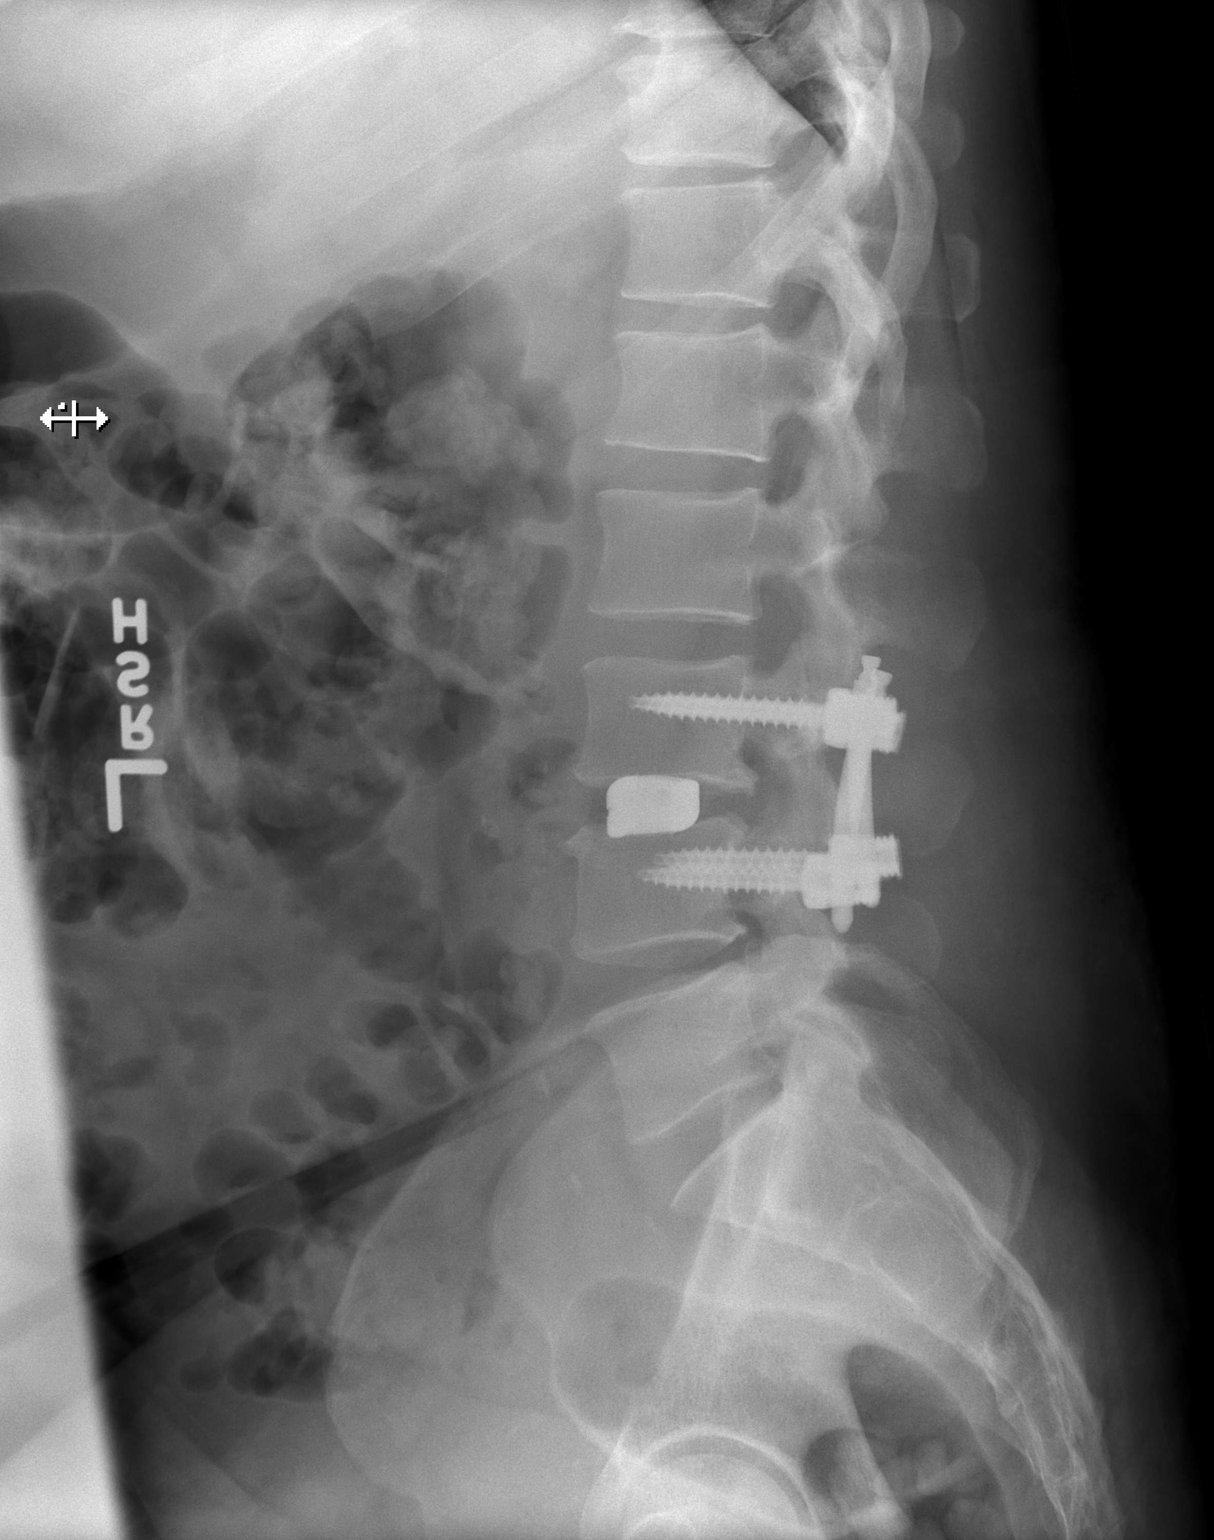

[2 of 2 positions shown; findings below may reference images not displayed]

FINDINGS: Pedicle screws are again seen at L3 and L4 with posterior fixation.
Interbody fusion is noted. The overall appearance is stable from the
previous day. No acute bony abnormality is seen. No soft tissue
abnormality is noted.
IMPRESSION: Status post fusion at L3-4 without significant interval change.
